# Patient Record
Sex: Female | Born: 1985 | Race: Black or African American | Hispanic: No | Marital: Single | Smoking: Never smoker
Health system: Southern US, Community
[De-identification: ages and names within clinical notes are randomized; demographics above are authoritative.]

## PROBLEM LIST (undated history)

## (undated) DIAGNOSIS — A749 Chlamydial infection, unspecified: Secondary | ICD-10-CM

## (undated) DIAGNOSIS — N390 Urinary tract infection, site not specified: Secondary | ICD-10-CM

## (undated) DIAGNOSIS — E669 Obesity, unspecified: Secondary | ICD-10-CM

## (undated) DIAGNOSIS — D649 Anemia, unspecified: Secondary | ICD-10-CM

## (undated) DIAGNOSIS — I10 Essential (primary) hypertension: Secondary | ICD-10-CM

## (undated) DIAGNOSIS — F419 Anxiety disorder, unspecified: Secondary | ICD-10-CM

## (undated) DIAGNOSIS — F32A Depression, unspecified: Secondary | ICD-10-CM

## (undated) HISTORY — PX: NO PAST SURGERIES: SHX2092

---
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
## 2007-03-17 ENCOUNTER — Emergency Department (HOSPITAL_COMMUNITY): Admission: EM | Admit: 2007-03-17 | Discharge: 2007-03-17 | Payer: Self-pay | Admitting: Emergency Medicine

## 2007-03-19 ENCOUNTER — Emergency Department (HOSPITAL_COMMUNITY): Admission: EM | Admit: 2007-03-19 | Discharge: 2007-03-19 | Payer: Self-pay | Admitting: Family Medicine

## 2008-09-05 ENCOUNTER — Emergency Department (HOSPITAL_COMMUNITY): Admission: EM | Admit: 2008-09-05 | Discharge: 2008-09-05 | Payer: Self-pay | Admitting: Emergency Medicine

## 2008-09-15 ENCOUNTER — Emergency Department (HOSPITAL_COMMUNITY): Admission: EM | Admit: 2008-09-15 | Discharge: 2008-09-15 | Payer: Self-pay | Admitting: Family Medicine

## 2009-05-17 ENCOUNTER — Emergency Department (HOSPITAL_COMMUNITY): Admission: EM | Admit: 2009-05-17 | Discharge: 2009-05-17 | Payer: Self-pay | Admitting: Emergency Medicine

## 2010-04-09 ENCOUNTER — Emergency Department (HOSPITAL_COMMUNITY): Admission: EM | Admit: 2010-04-09 | Discharge: 2010-04-09 | Payer: Self-pay | Admitting: Family Medicine

## 2010-10-24 LAB — CBC
HCT: 35.8 % — ABNORMAL LOW (ref 36.0–46.0)
Platelets: 354 10*3/uL (ref 150–400)
RDW: 14.8 % (ref 11.5–15.5)
WBC: 10.8 10*3/uL — ABNORMAL HIGH (ref 4.0–10.5)

## 2010-10-24 LAB — DIFFERENTIAL
Basophils Absolute: 0 10*3/uL (ref 0.0–0.1)
Lymphocytes Relative: 12 % (ref 12–46)
Monocytes Absolute: 0.7 10*3/uL (ref 0.1–1.0)
Neutro Abs: 8.5 10*3/uL — ABNORMAL HIGH (ref 1.7–7.7)

## 2010-10-24 LAB — POCT PREGNANCY, URINE: Preg Test, Ur: NEGATIVE

## 2010-10-24 LAB — POCT URINALYSIS DIPSTICK
Ketones, ur: NEGATIVE mg/dL
Nitrite: NEGATIVE
Specific Gravity, Urine: 1.02 (ref 1.005–1.030)
Urobilinogen, UA: 1 mg/dL (ref 0.0–1.0)

## 2010-11-13 LAB — CULTURE, ROUTINE-ABSCESS

## 2010-11-25 LAB — CULTURE, ROUTINE-ABSCESS

## 2011-05-25 LAB — CULTURE, ROUTINE-ABSCESS

## 2012-01-05 ENCOUNTER — Encounter (HOSPITAL_COMMUNITY): Payer: Self-pay | Admitting: Emergency Medicine

## 2012-01-05 ENCOUNTER — Emergency Department (INDEPENDENT_AMBULATORY_CARE_PROVIDER_SITE_OTHER)
Admission: EM | Admit: 2012-01-05 | Discharge: 2012-01-05 | Disposition: A | Discharge status: Home / Self Care | Payer: Self-pay | Source: Home / Self Care | Attending: Emergency Medicine | Admitting: Emergency Medicine

## 2012-01-05 DIAGNOSIS — L304 Erythema intertrigo: Secondary | ICD-10-CM

## 2012-01-05 DIAGNOSIS — L0390 Cellulitis, unspecified: Secondary | ICD-10-CM

## 2012-01-05 DIAGNOSIS — L538 Other specified erythematous conditions: Secondary | ICD-10-CM

## 2012-01-05 DIAGNOSIS — N760 Acute vaginitis: Secondary | ICD-10-CM

## 2012-01-05 DIAGNOSIS — L0291 Cutaneous abscess, unspecified: Secondary | ICD-10-CM

## 2012-01-05 HISTORY — DX: Urinary tract infection, site not specified: N39.0

## 2012-01-05 HISTORY — DX: Obesity, unspecified: E66.9

## 2012-01-05 HISTORY — DX: Chlamydial infection, unspecified: A74.9

## 2012-01-05 LAB — WET PREP, GENITAL

## 2012-01-05 LAB — POCT URINALYSIS DIP (DEVICE)
Protein, ur: 30 mg/dL — AB
Specific Gravity, Urine: 1.015 (ref 1.005–1.030)
Urobilinogen, UA: 2 mg/dL — ABNORMAL HIGH (ref 0.0–1.0)

## 2012-01-05 MED ORDER — CEPHALEXIN 500 MG PO CAPS: 500.0000 mg | ORAL_CAPSULE | Freq: Four times a day (QID) | ORAL | Status: AC

## 2012-01-05 MED ORDER — METRONIDAZOLE 500 MG PO TABS: 500.0000 mg | ORAL_TABLET | Freq: Two times a day (BID) | ORAL | Status: AC

## 2012-01-05 MED ORDER — FLUCONAZOLE 150 MG PO TABS: ORAL_TABLET | ORAL | Status: AC

## 2012-01-05 MED ORDER — MICONAZOLE NITRATE 2 % EX CREA: TOPICAL_CREAM | Freq: Two times a day (BID) | CUTANEOUS | Status: AC

## 2012-01-05 NOTE — Discharge Instructions (Signed)
Take the medication as written. Give us a working phone number so that we can contact you if needed. Refrain from sexual contact until you know your results and your partner(s) are treated. Return if you get worse, have a fever >100.4, or for any concerns.   Go to www.goodrx.com to look up your medications. This will give you a list of where you can find your prescriptions at the most affordable prices.    Go to www.goodrx.com to look up your medications. This will give you a list of where you can find your prescriptions at the most affordable prices.   Call [facility]  [phone]  If you have no primary doctor, here are some resources that may be helpful:  Medicaid-accepting [place] Providers:   - [facility]- [address]      [phone]      Mon-Fri 9am-7pm, Sat 9am-1pm   - [facility]- [address], [address]      [phone]   - [facility]- [address]      [phone]   - [facility]- [address] [place] [address]      [phone]   - [facility]- [address]      [phone]      Only accepts [place] Access Medicaid patients       after they have her name applied to their card   Self Pay (no insurance) in [place]:   - Sickle Cell Patients: Dr Eric Dean, [facility]      [address]      [phone]   - [facility]- [phone]   - Physician Referral Service- [phone]   - [facility]- [address]      [phone]   - [facility]- [address]   - [facility]- see information above      (Speak to Pam H if you do not have insurance)   - [facility]- [address]      [phone]   - [facility] [place]- [address]      [phone]   - [facility]- [address] [place] [address]      [phone]   - Dr Osei-Bonsu-  [address],  [place]      [phone]   - [facility]- [address]      [phone]   - [facility]- [address] [place] [address]      [phone]      Also [address]      [phone]   - [facility]- [address]      [phone]      1st and 3rd Saturday every month, 10am-1pm    Other agencies that provide inexpensive medical care:     [facility]  [phone]    [facility]  [phone]    [facility]  [phone]    [facility]  [phone] [place] [address]    [facility]  [phone]    [facility]  [phone] [facility] [phone]   [address] [place], [place] [address]  Chronic Pain Problems Contact [facility]    [phone] Patients need to be referred by their primary care doctor.  [place] Resources  [facility] [place]     [employer]                          [place] [facility] [address] [place]                       [address]      [address]   [phone] (After Hours)  General Information: Finding a doctor when you do not have health insurance can be tricky. Although you are not limited by an insurance plan, you are of course limited by her finances and how much but he can pay out of pocket.  What are your options if you don't have health insurance?   1) Find a Doctor and Pay Out of Pocket Although you won't have to find out who is covered by your insurance plan, it is a good idea to ask around and get recommendations. You will then need to call the office and see if the doctor you have chosen will accept you as a new patient and what types of options they offer for patients who are self-pay. Some doctors offer discounts or will set up payment plans for their patients who do not have insurance, but you will need to ask so you aren't surprised when  you get to your appointment.  2) Contact Your Local Health Department Not all health departments have doctors that can see patients for sick visits, but many do, so it is worth a call to see if yours does. If you don't know where your local health department is, you can check in your phone book. The CDC also has a tool to help you locate your state's health department, and many state websites also have listings of all of their local health departments.  3) Find a Walk-in Clinic If your illness is not likely to be very severe or complicated, you may want to try a walk in clinic. These are popping up all over the country in pharmacies, drugstores, and shopping centers. They're usually staffed by nurse practitioners or physician assistants that have been trained to treat common illnesses and complaints. They're usually fairly quick and inexpensive. However, if you have serious medical issues or chronic medical problems, these are probably not your best option   

## 2012-01-05 NOTE — ED Notes (Signed)
Pt having a boil under her panis that started off as a pressure and has since gotten bigger and is spreading after a warm bath. Pt now describes it as large and flat with 4 boils underneath. Pt states the pain is so bad that she feels nauseous and has vomited with standing. Pt also states she has had recent itching, malodorous discharge, and bumps in her vagina.

## 2012-01-06 LAB — GC/CHLAMYDIA PROBE AMP, GENITAL: GC Probe Amp, Genital: NEGATIVE

## 2012-01-06 NOTE — ED Provider Notes (Signed)
History     CSN: [id number]  Arrival date & time 01/05/12  0841   First MD Initiated Contact with Patient 01/05/12 [phone]      Chief Complaint  Patient presents with  . Recurrent Skin Infections    (Consider location/radiation/quality/duration/timing/severity/associated sxs/prior treatment) HPI Comments: Patient presents with several issues: First, patient reports a tender, erythematous area underneath her pannus and upper groin. States it started off as a sore, itchy area with an odor, and is now getting worse. She tried taking a warm bath, which made her symptoms worse. No fevers at home. States she vomited once due to the pain. Is otherwise tolerating by mouth. Patient is morbidly obese, and has difficulty with personal hygiene. She has no history of diabetes. Second, She also reports painful "bumps" on her mons pubis starting around this time. Unsure if they are blisters. She has no history of herpes. Pain is worse with palpation. No alleviating factors. She has not tried anything for this. Third, she reports an odorous vaginal discharge and vaginal itching. No aggravating or alleviating factors. No urinary urgency, frequency, dysuria, cloudy or oderous urine. No known vaginal blisters. No recent antibiotics. She states she is currently not sexually active. Last intercourse with a female partner on 4/17. They do not use condoms. She does not know if he is having any symptoms. She has a remote history of Chlamydia. No history of gonorrhea, BV, yeast vaginitis, trichomoniasis, herpes, HIV, syphilis.    ROS as noted in HPI. All other ROS negative.   Patient is a 26 y.o. female presenting with vaginal discharge and rash. The history is provided by the patient. No language interpreter was used.  Vaginal Discharge This is a new problem. The current episode started 2 days ago. The problem occurs constantly. The problem has been gradually worsening. Pertinent negatives include no abdominal pain. The  symptoms are aggravated by nothing. The symptoms are relieved by nothing. She has tried nothing for the symptoms. The treatment provided no relief.  Rash  This is a new problem. The current episode started more than 2 days ago. The problem is associated with an unknown factor. There has been no fever. The rash is present on the groin and abdomen. Associated symptoms include blisters and pain. Pertinent negatives include no itching and no weeping. She has tried nothing for the symptoms. The treatment provided no relief.    Past Medical History  Diagnosis Date  . Chlamydia   . Obesity   . UTI (lower urinary tract infection)     proteus UTI    History reviewed. No pertinent past surgical history.  History reviewed. No pertinent family history.  History  Substance Use Topics  . Smoking status: Never Smoker   . Smokeless tobacco: Not on file  . Alcohol Use: No    OB History    Grav Para Term Preterm Abortions TAB SAB Ect Mult Living                  Review of Systems  Gastrointestinal: Negative for abdominal pain.  Genitourinary: Positive for vaginal discharge.  Skin: Positive for rash. Negative for itching.    Allergies  Review of patient's allergies indicates no known allergies.  Home Medications   Current Outpatient Rx  Name Route Sig Dispense Refill  . CEPHALEXIN 500 MG PO CAPS Oral Take 1 capsule (500 mg total) by mouth 4 (four) times daily. X 10 days 40 capsule 0  . FLUCONAZOLE 150 MG PO TABS  1  tab po x 1. May repeat in 72 hours if no improvement 2 tablet 0  . METRONIDAZOLE 500 MG PO TABS Oral Take 1 tablet (500 mg total) by mouth 2 (two) times daily. X 7 days 14 tablet 0  . MICONAZOLE NITRATE 2 % EX CREA Topical Apply topically 2 (two) times daily. 28.35 g 0    BP 155/98  Pulse 115  Temp(Src) 100 F (37.8 C) (Oral)  Resp 18  SpO2 100%  LMP 12/21/2011  Physical Exam  Nursing note and vitals reviewed. Constitutional: She is oriented to person, place, and  time. She appears well-developed and well-nourished. No distress.       Morbidly obese  HENT:  Head: Normocephalic and atraumatic.  Eyes: Conjunctivae and EOM are normal.  Neck: Normal range of motion.  Cardiovascular: Normal rate and regular rhythm.   Pulmonary/Chest: Effort normal.  Abdominal: Soft. Bowel sounds are normal. She exhibits no distension. There is no tenderness. There is no CVA tenderness.         Symmetric, tender, macerated area with induration underneath pannus. No central fluctuance.  Genitourinary: There is no rash on the right labia. There is no rash on the left labia. Uterus is not tender. Cervix exhibits discharge. Cervix exhibits no motion tenderness and no friability. Right adnexum displays no mass, no tenderness and no fullness. Left adnexum displays no mass, no tenderness and no fullness.       Thin yellowish oderous vaginal discharge. No ulcers or vaginal walls or on os Several tender raised papules on mons pubis. No evidence of pubic lice.  Musculoskeletal: Normal range of motion.  Neurological: She is alert and oriented to person, place, and time.  Skin: Skin is warm and dry. Rash noted.       See abdominal and GU exam  Psychiatric: She has a normal mood and affect. Her behavior is normal. Judgment and thought content normal.    ED Course  Procedures (including critical care time)  Labs Reviewed  WET PREP, GENITAL - Abnormal; Notable for the following:    Trich, Wet Prep MODERATE (*)    Clue Cells Wet Prep HPF POC FEW (*)    WBC, Wet Prep HPF POC TOO NUMEROUS TO COUNT (*)    All other components within normal limits  POCT URINALYSIS DIP (DEVICE) - Abnormal; Notable for the following:    Bilirubin Urine SMALL (*)    Ketones, ur TRACE (*)    Hgb urine dipstick TRACE (*)    Protein, ur 30 (*)    Urobilinogen, UA 2.0 (*)    Leukocytes, UA SMALL (*) Biochemical Testing Only. Please order routine urinalysis from main lab if confirmatory testing is needed.     All other components within normal limits  POCT PREGNANCY, URINE  GC/CHLAMYDIA PROBE AMP, GENITAL  HERPES SIMPLEX VIRUS CULTURE   No results found.   1. Intertrigo   2. Vaginitis   3. Cellulitis     MDM  H&P most c/w candidal intertrigo with secondary cellulitis underneath pannus. Patient has tender papules on her mons pubis, could be foliculitis which will be treated with Keflex. unroofed a papule and sent this off for HSV.Marland Kitchen Deferring acyclovir treatment until results obtained. Pelvic exam is consistent BV. Sent off GC/chlamydia, wet prep.  Will not treat empirically now for gonorrhea and Chlamydia. Will send home with flagyl for BV, topical miconazole and diflucan for yeast infection. Keflex for the secondary cellulitis. Advised pt to refrain from sexual contact until she knows  lab results, symptoms resolve, and partner(s) are treated. Pt provided working phone number. Pt agrees.     Luiz Blare, MD 01/06/12 1626

## 2012-01-07 ENCOUNTER — Telehealth (HOSPITAL_COMMUNITY): Payer: Self-pay | Admitting: *Deleted

## 2012-01-07 LAB — HERPES SIMPLEX VIRUS CULTURE: Special Requests: NORMAL

## 2012-01-07 NOTE — ED Notes (Signed)
GC/Chlamydia neg., Wet prep: Mod. Trich., few clue cells, WBC's TNTC, Herpes culture: No Herpes Simplex detected.  Pt. adequately treated with Flagyl.  I called and left message for pt. to call. Vassie Moselle 01/07/2012

## 2012-01-08 ENCOUNTER — Telehealth (HOSPITAL_COMMUNITY): Payer: Self-pay | Admitting: *Deleted

## 2012-01-13 ENCOUNTER — Telehealth (HOSPITAL_COMMUNITY): Payer: Self-pay | Admitting: *Deleted

## 2012-01-13 NOTE — ED Notes (Signed)
Unable to reach pt. by phone. Letter sent. Colleen Garrett 01/13/2012

## 2012-02-01 ENCOUNTER — Telehealth (HOSPITAL_COMMUNITY): Payer: Self-pay | Admitting: *Deleted

## 2012-02-01 NOTE — ED Notes (Signed)
Letter returned unopened.  Unable to contact pt. Colleen Garrett 02/01/2012

## 2013-09-14 ENCOUNTER — Emergency Department (INDEPENDENT_AMBULATORY_CARE_PROVIDER_SITE_OTHER)
Admission: EM | Admit: 2013-09-14 | Discharge: 2013-09-14 | Disposition: A | Discharge status: Home / Self Care | Payer: Self-pay | Source: Home / Self Care | Attending: Family Medicine | Admitting: Family Medicine

## 2013-09-14 ENCOUNTER — Encounter (HOSPITAL_COMMUNITY): Payer: Self-pay | Admitting: Emergency Medicine

## 2013-09-14 DIAGNOSIS — I10 Essential (primary) hypertension: Secondary | ICD-10-CM

## 2013-09-14 DIAGNOSIS — J020 Streptococcal pharyngitis: Secondary | ICD-10-CM

## 2013-09-14 LAB — POCT I-STAT, CHEM 8
BUN: 16 mg/dL (ref 6–23)
CALCIUM ION: 1.2 mmol/L (ref 1.12–1.23)
Chloride: 102 mEq/L (ref 96–112)
Creatinine, Ser: 1 mg/dL (ref 0.50–1.10)
Glucose, Bld: 102 mg/dL — ABNORMAL HIGH (ref 70–99)
HCT: 33 % — ABNORMAL LOW (ref 36.0–46.0)
HEMOGLOBIN: 11.2 g/dL — AB (ref 12.0–15.0)
Potassium: 3.6 mEq/L — ABNORMAL LOW (ref 3.7–5.3)
SODIUM: 142 meq/L (ref 137–147)
TCO2: 29 mmol/L (ref 0–100)

## 2013-09-14 LAB — POCT RAPID STREP A: Streptococcus, Group A Screen (Direct): POSITIVE — AB

## 2013-09-14 MED ORDER — HYDROCHLOROTHIAZIDE 25 MG PO TABS: 25.0000 mg | ORAL_TABLET | Freq: Every day | ORAL | Status: DC

## 2013-09-14 MED ORDER — AMOXICILLIN 500 MG PO CAPS: 500.0000 mg | ORAL_CAPSULE | Freq: Two times a day (BID) | ORAL | Status: DC

## 2013-09-14 NOTE — ED Provider Notes (Signed)
Colleen Garrett is a 28 y.o. female who presents to Urgent Care today for 8-9 days of sore throat nausea and pain with swallowing. Patient has moderate to severe throat pain worse with swallowing. She denies any significant cough or shortness of breath. She had some vomiting last week but currently has no vomiting. She's tried some Tylenol which has not helped very much.  She has had elevated blood pressure in the past but has never been diagnosed with hypertension. She currently denies any syncope lightheadedness or weakness chest pains or palpitations.   Past Medical History  Diagnosis Date  . Chlamydia   . Obesity   . UTI (lower urinary tract infection)     proteus UTI   History  Substance Use Topics  . Smoking status: Never Smoker   . Smokeless tobacco: Not on file  . Alcohol Use: No   ROS as above Medications: No current facility-administered medications for this encounter.   Current Outpatient Prescriptions  Medication Sig Dispense Refill  . amoxicillin (AMOXIL) 500 MG capsule Take 1 capsule (500 mg total) by mouth 2 (two) times daily.  20 capsule  0  . hydrochlorothiazide (HYDRODIURIL) 25 MG tablet Take 1 tablet (25 mg total) by mouth daily.  30 tablet  2    Exam:  BP 161/112  Pulse 108  Temp(Src) 98.5 F (36.9 C) (Oral)  Resp 18  SpO2 98%  LMP 09/04/2013 Gen: Well NAD morbidly obese HEENT: EOMI,  MMM posterior pharynx is very erythematous. Tympanic membranes are normal appearing bilaterally. Patient has tender left-sided lymphadenopathy in the anterior cervical chain. Lungs: Normal work of breathing. CTABL Heart: RRR no MRG Abd: NABS, Soft. NT, ND Exts: Brisk capillary refill, warm and well perfused.   Results for orders placed during the hospital encounter of 09/14/13 (from the past 24 hour(s))  POCT RAPID STREP A ([facility] ONLY)     Status: Abnormal   Collection Time    09/14/13  8:24 AM      Result Value Range   Streptococcus, Group A Screen (Direct)  POSITIVE (*) NEGATIVE  POCT I-STAT, CHEM 8     Status: Abnormal   Collection Time    09/14/13  8:38 AM      Result Value Range   Sodium 142  137 - 147 mEq/L   Potassium 3.6 (*) 3.7 - 5.3 mEq/L   Chloride 102  96 - 112 mEq/L   BUN 16  6 - 23 mg/dL   Creatinine, Ser 1.611.00  0.50 - 1.10 mg/dL   Glucose, Bld 096102 (*) 70 - 99 mg/dL   Calcium, Ion 0.451.20  1.12 - 1.23 mmol/L   TCO2 29  0 - 100 mmol/L   Hemoglobin 11.2 (*) 12.0 - 15.0 g/dL   HCT 40.933.0 (*) 81.136.0 - 91.446.0 %   No results found.  Assessment and Plan: 28 y.o. female with  1) strep pharyngitis. Plan to treat with amoxicillin. Additionally we'll use Tylenol for pain control. Work note provided as well.  2) hypertension: Patient has had several episodes of elevated blood pressure. Plan to start patient on hydrochlorothiazide. Recommend followup at the [facility] in a few weeks.  Discussed warning signs or symptoms. Please see discharge instructions. Patient expresses understanding.    Rodolph BongEvan S Shamir Sedlar, MD 09/14/13 [phone]

## 2013-09-14 NOTE — ED Notes (Signed)
C/o sore throat for a week and half now  States she has had cold sx but now she only has a sore throat OTC medication used but no relief.

## 2013-09-14 NOTE — Discharge Instructions (Signed)
Thank you for coming in today. Strep Throat:  Take amoxicillin twice daily for 10 days.  Use 2 extra strength tylenol every 6 hours for pain as needed for pain.   High Blood Pressure: Please start taking hydrochlorothiazide (HCTZ) daily.  Get your blood pressure rechecked in about one month. Call or go to the emergency room if you get worse, have trouble breathing, have chest pains, or palpitations.   Please see Rudell Cobb to qualify for reduced or free medical services within the [facility].  Call her at [phone] today. Follow up at the [facility] [address] [place], [place] [address] [phone]   Arterial Hypertension Arterial hypertension (high blood pressure) is a condition of elevated pressure in your blood vessels. Hypertension over a long period of time is a risk factor for strokes, heart attacks, and heart failure. It is also the leading cause of kidney (renal) failure.  CAUSES   In Adults -- Over 90% of all hypertension has no known cause. This is called essential or primary hypertension. In the other 10% of people with hypertension, the increase in blood pressure is caused by another disorder. This is called secondary hypertension. Important causes of secondary hypertension are:  Heavy alcohol use.  Obstructive sleep apnea.  Hyperaldosterosim (Conn's syndrome).  Steroid use.  Chronic kidney failure.  Hyperparathyroidism.  Medications.  Renal artery stenosis.  Pheochromocytoma.  Cushing's disease.  Coarctation of the aorta.  Scleroderma renal crisis.  Licorice (in excessive amounts).  Drugs (cocaine, methamphetamine). Your caregiver can explain any items above that apply to you.  In Children -- Secondary hypertension is more common and should always be considered.  Pregnancy -- Few women of childbearing age have high blood pressure. However, up to 10% of them develop hypertension of pregnancy.  Generally, this will not harm the woman. It may be a sign of 3 complications of pregnancy: preeclampsia, HELLP syndrome, and eclampsia. Follow up and control with medication is necessary. SYMPTOMS   This condition normally does not produce any noticeable symptoms. It is usually found during a routine exam.  Malignant hypertension is a late problem of high blood pressure. It may have the following symptoms:  Headaches.  Blurred vision.  End-organ damage (this means your kidneys, heart, lungs, and other organs are being damaged).  Stressful situations can increase the blood pressure. If a person with normal blood pressure has their blood pressure go up while being seen by their caregiver, this is often termed "white coat hypertension." Its importance is not known. It may be related with eventually developing hypertension or complications of hypertension.  Hypertension is often confused with mental tension, stress, and anxiety. DIAGNOSIS  The diagnosis is made by 3 separate blood pressure measurements. They are taken at least 1 week apart from each other. If there is organ damage from hypertension, the diagnosis may be made without repeat measurements. Hypertension is usually identified by having blood pressure readings:  Above 140/90 mmHg measured in both arms, at 3 separate times, over a couple weeks.  Over 130/80 mmHg should be considered a risk factor and may require treatment in patients with diabetes. Blood pressure readings over 120/80 mmHg are called "pre-hypertension" even in non-diabetic patients. To get a true blood pressure measurement, use the following guidelines. Be aware of the factors that can alter blood pressure readings.  Take measurements at least 1 hour after caffeine.  Take measurements 30 minutes after smoking and without any stress. This is another  reason to quit smoking  it raises your blood pressure.  Use a proper cuff size. Ask your caregiver if you are not sure  about your cuff size.  Most home blood pressure cuffs are automatic. They will measure systolic and diastolic pressures. The systolic pressure is the pressure reading at the start of sounds. Diastolic pressure is the pressure at which the sounds disappear. If you are elderly, measure pressures in multiple postures. Try sitting, lying or standing.  Sit at rest for a minimum of 5 minutes before taking measurements.  You should not be on any medications like decongestants. These are found in many cold medications.  Record your blood pressure readings and review them with your caregiver. If you have hypertension:  Your caregiver may do tests to be sure you do not have secondary hypertension (see "causes" above).  Your caregiver may also look for signs of metabolic syndrome. This is also called Syndrome X or Insulin Resistance Syndrome. You may have this syndrome if you have type 2 diabetes, abdominal obesity, and abnormal blood lipids in addition to hypertension.  Your caregiver will take your medical and family history and perform a physical exam.  Diagnostic tests may include blood tests (for glucose, cholesterol, potassium, and kidney function), a urinalysis, or an EKG. Other tests may also be necessary depending on your condition. PREVENTION  There are important lifestyle issues that you can adopt to reduce your chance of developing hypertension:  Maintain a normal weight.  Limit the amount of salt (sodium) in your diet.  Exercise often.  Limit alcohol intake.  Get enough potassium in your diet. Discuss specific advice with your caregiver.  Follow a DASH diet (dietary approaches to stop hypertension). This diet is rich in fruits, vegetables, and low-fat dairy products, and avoids certain fats. PROGNOSIS  Essential hypertension cannot be cured. Lifestyle changes and medical treatment can lower blood pressure and reduce complications. The prognosis of secondary hypertension depends on  the underlying cause. Many people whose hypertension is controlled with medicine or lifestyle changes can live a normal, healthy life.  RISKS AND COMPLICATIONS  While high blood pressure alone is not an illness, it often requires treatment due to its short- and long-term effects on many organs. Hypertension increases your risk for:  CVAs or strokes (cerebrovascular accident).  Heart failure due to chronically high blood pressure (hypertensive cardiomyopathy).  Heart attack (myocardial infarction).  Damage to the retina (hypertensive retinopathy).  Kidney failure (hypertensive nephropathy). Your caregiver can explain list items above that apply to you. Treatment of hypertension can significantly reduce the risk of complications. TREATMENT   For overweight patients, weight loss and regular exercise are recommended. Physical fitness lowers blood pressure.  Mild hypertension is usually treated with diet and exercise. A diet rich in fruits and vegetables, fat-free dairy products, and foods low in fat and salt (sodium) can help lower blood pressure. Decreasing salt intake decreases blood pressure in a 1/3 of people.  Stop smoking if you are a smoker. The steps above are highly effective in reducing blood pressure. While these actions are easy to suggest, they are difficult to achieve. Most patients with moderate or severe hypertension end up requiring medications to bring their blood pressure down to a normal level. There are several classes of medications for treatment. Blood pressure pills (antihypertensives) will lower blood pressure by their different actions. Lowering the blood pressure by 10 mmHg may decrease the risk of complications by as much as 25%. The goal of treatment is effective  blood pressure control. This will reduce your risk for complications. Your caregiver will help you determine the best treatment for you according to your lifestyle. What is excellent treatment for one person,  may not be for you. HOME CARE INSTRUCTIONS   Do not smoke.  Follow the lifestyle changes outlined in the "Prevention" section.  If you are on medications, follow the directions carefully. Blood pressure medications must be taken as prescribed. Skipping doses reduces their benefit. It also puts you at risk for problems.  Follow up with your caregiver, as directed.  If you are asked to monitor your blood pressure at home, follow the guidelines in the "Diagnosis" section above. SEEK MEDICAL CARE IF:   You think you are having medication side effects.  You have recurrent headaches or lightheadedness.  You have swelling in your ankles.  You have trouble with your vision. SEEK IMMEDIATE MEDICAL CARE IF:   You have sudden onset of chest pain or pressure, difficulty breathing, or other symptoms of a heart attack.  You have a severe headache.  You have symptoms of a stroke (such as sudden weakness, difficulty speaking, difficulty walking). MAKE SURE YOU:   Understand these instructions.  Will watch your condition.  Will get help right away if you are not doing well or get worse. Document Released: 07/27/2005 Document Revised: 10/19/2011 Document Reviewed: 02/24/2007 [facility] Patient Information 2014 [place], [place].

## 2013-10-31 ENCOUNTER — Inpatient Hospital Stay (HOSPITAL_COMMUNITY)
Admission: AD | Admit: 2013-10-31 | Discharge: 2013-10-31 | Disposition: A | Discharge status: Home / Self Care | Payer: Self-pay | Source: Ambulatory Visit | Attending: Family Medicine | Admitting: Family Medicine

## 2013-10-31 DIAGNOSIS — I10 Essential (primary) hypertension: Secondary | ICD-10-CM | POA: Insufficient documentation

## 2013-10-31 DIAGNOSIS — Z3202 Encounter for pregnancy test, result negative: Secondary | ICD-10-CM

## 2013-10-31 LAB — URINALYSIS, ROUTINE W REFLEX MICROSCOPIC
Bilirubin Urine: NEGATIVE
Glucose, UA: NEGATIVE mg/dL
Hgb urine dipstick: NEGATIVE
Ketones, ur: NEGATIVE mg/dL
LEUKOCYTES UA: NEGATIVE
Nitrite: NEGATIVE
PROTEIN: NEGATIVE mg/dL
Specific Gravity, Urine: >1.03 — ABNORMAL HIGH (ref 1.005–1.030)
UROBILINOGEN UA: 0.2 mg/dL (ref 0.0–1.0)
pH: 6 (ref 5.0–8.0)

## 2013-10-31 LAB — HCG, QUANTITATIVE, PREGNANCY: hCG, Beta Chain, Quant, S: 1 m[IU]/mL (ref <5)

## 2013-10-31 NOTE — MAU Note (Signed)
Patient states she has had a positive home pregnancy test. States she has a little nausea off and on. Denies any other problems, no pain, bleeding or discharge.

## 2013-10-31 NOTE — MAU Provider Note (Signed)
HPI:  Ms. Colleen Garrett is a [age] y.o. female G0 who presents to MAU for pregnancy confirmation. She had a positive pregnancy test at home and came here for confirmation. She and her significant other have been trying to conceive for 3 months. Urine pregnancy test in MAU was negative; beta hcg level ordered. Pt denies pain or bleeding at this time.    Objective:  GENERAL: Well-developed, well-nourished, obese female in no acute distress, pt is tearful  HEENT: Normocephalic, atraumatic.   LUNGS: Effort normal HEART: Regular rate  SKIN: Warm, dry and without erythema PSYCH: Normal mood and affect  Filed Vitals:   10/31/13 1301  BP: 165/107  Pulse: 96  Temp: 98.5 F (36.9 C)  Resp: 20    Results for orders placed during the hospital encounter of 10/31/13 (from the past 48 hour(s))  URINALYSIS, ROUTINE W REFLEX MICROSCOPIC     Status: Abnormal   Collection Time    10/31/13  1:05 PM      Result Value Ref Range   Color, Urine YELLOW  YELLOW   APPearance CLEAR  CLEAR   Specific Gravity, Urine >1.030 (*) 1.005 - 1.030   pH 6.0  5.0 - 8.0   Glucose, UA NEGATIVE  NEGATIVE mg/dL   Hgb urine dipstick NEGATIVE  NEGATIVE   Bilirubin Urine NEGATIVE  NEGATIVE   Ketones, ur NEGATIVE  NEGATIVE mg/dL   Protein, ur NEGATIVE  NEGATIVE mg/dL   Urobilinogen, UA 0.2  0.0 - 1.0 mg/dL   Nitrite NEGATIVE  NEGATIVE   Leukocytes, UA NEGATIVE  NEGATIVE   Comment: MICROSCOPIC NOT DONE ON URINES WITH NEGATIVE PROTEIN, BLOOD, LEUKOCYTES, NITRITE, OR GLUCOSE <1000 mg/dL.  HCG, QUANTITATIVE, PREGNANCY     Status: None   Collection Time    10/31/13  1:36 PM      Result Value Ref Range   hCG, Beta Chain, Quant, S 1  <5 mIU/mL   Comment:              GEST. AGE      CONC.  (mIU/mL)       <=1 WEEK        5 - 50         2 WEEKS       50 - 500         3 WEEKS       100 - 10,000         4 WEEKS     1,000 - 30,000         5 WEEKS     3,500 - 115,000       6-8 WEEKS     12,000 - 270,000        12 WEEKS      15,000 - 220,000                FEMALE AND NON-PREGNANT FEMALE:         LESS THAN 5 mIU/mL    MDM Urine pregnancy test negative Beta hcg level ordered UA  A: Encounter for pregnancy test with beta quant negative Chronic hypertension   P: Discharge home in stable condition Discussed the importance of taking BP medication as [facility] provided to [facility] patient  Stroke signs and symptoms reviewed; pt to go to the nearest ED with any chest pain/ HA .    Iona HansenJennifer Irene Rasch, NP 10/31/2013 3:10 PM

## 2013-10-31 NOTE — MAU Provider Note (Signed)
Attestation of Attending Supervision of Advanced Practitioner (PA/CNM/NP): Evaluation and management procedures were performed by the Advanced Practitioner under my supervision and collaboration.  I have reviewed the Advanced Practitioner's note and chart, and I agree with the management and plan.  Madie Cahn S, MD [facility] Attending 10/31/2013 3:45 PM   

## 2014-04-24 ENCOUNTER — Emergency Department (HOSPITAL_COMMUNITY)
Admission: EM | Admit: 2014-04-24 | Discharge: 2014-04-24 | Disposition: A | Discharge status: Home / Self Care | Payer: Self-pay | Attending: Emergency Medicine | Admitting: Emergency Medicine

## 2014-04-24 ENCOUNTER — Encounter (HOSPITAL_COMMUNITY): Payer: Self-pay | Admitting: Emergency Medicine

## 2014-04-24 DIAGNOSIS — IMO0002 Reserved for concepts with insufficient information to code with codable children: Secondary | ICD-10-CM | POA: Insufficient documentation

## 2014-04-24 DIAGNOSIS — E669 Obesity, unspecified: Secondary | ICD-10-CM | POA: Insufficient documentation

## 2014-04-24 DIAGNOSIS — L089 Local infection of the skin and subcutaneous tissue, unspecified: Secondary | ICD-10-CM | POA: Insufficient documentation

## 2014-04-24 DIAGNOSIS — Z79899 Other long term (current) drug therapy: Secondary | ICD-10-CM | POA: Insufficient documentation

## 2014-04-24 DIAGNOSIS — L02412 Cutaneous abscess of left axilla: Secondary | ICD-10-CM

## 2014-04-24 DIAGNOSIS — Z8619 Personal history of other infectious and parasitic diseases: Secondary | ICD-10-CM | POA: Insufficient documentation

## 2014-04-24 DIAGNOSIS — Z87440 Personal history of urinary (tract) infections: Secondary | ICD-10-CM | POA: Insufficient documentation

## 2014-04-24 MED ORDER — LIDOCAINE-EPINEPHRINE 2 %-1:100000 IJ SOLN
20.0000 mL | Freq: Once | INTRAMUSCULAR | Status: DC
  Filled 2014-04-24: qty 20

## 2014-04-24 MED ORDER — LIDOCAINE-EPINEPHRINE (PF) 2 %-1:200000 IJ SOLN
20.0000 mL | Freq: Once | INTRAMUSCULAR | Status: AC
  Administered 2014-04-24: 20 mL via INTRADERMAL
  Filled 2014-04-24: qty 20

## 2014-04-24 MED ORDER — HYDROCHLOROTHIAZIDE 25 MG PO TABS: 25.0000 mg | ORAL_TABLET | Freq: Every day | ORAL | Status: DC

## 2014-04-24 MED ORDER — SULFAMETHOXAZOLE-TRIMETHOPRIM 800-160 MG PO TABS: 2.0000 | ORAL_TABLET | Freq: Two times a day (BID) | ORAL | Status: DC

## 2014-04-24 MED ORDER — LIDOCAINE-EPINEPHRINE 2 %-1:100000 IJ SOLN
10.0000 mL | Freq: Once | INTRAMUSCULAR | Status: DC
  Filled 2014-04-24: qty 10

## 2014-04-24 MED ORDER — HYDROCODONE-ACETAMINOPHEN 5-325 MG PO TABS: 1.0000 | ORAL_TABLET | Freq: Four times a day (QID) | ORAL | Status: DC | PRN

## 2014-04-24 NOTE — ED Notes (Signed)
Pt reports she is supposed to be taking antihypertive medication, but has not taken any "in a while." Sts "I leave the hospital and I feel great, then I start forgetting to take them."

## 2014-04-24 NOTE — ED Provider Notes (Signed)
CSN: [id number]     Arrival date & time 04/24/14  8295 History  This chart was scribed for non-physician practitioner Santiago Glad working with Audree Camel, MD by Carl Best, ED Scribe. This patient was seen in room TR11C/TR11C and the patient's care was started at 9:50 AM.      Chief Complaint  Patient presents with  . Recurrent Skin Infections    The history is provided by the patient. No language interpreter was used.   HPI Comments: Colleen Garrett is a 28 y.o. female who presents to the Emergency Department complaining of a painful abscess of her left axilla that she noticed 2 weeks ago.  Area gradually worsening.  She has not noticed any drainage. She denies nausea, vomiting, numbness or tingling in her left arm, fever, and chills as associated symptoms.   She applied Triamcinolone to the area with no relief to her symptoms.  She has not taken any medication for the pain.  She has a history of abscesses under her right arm and chest.  She does not have a history of HIV or DM.     Past Medical History  Diagnosis Date  . Chlamydia   . Obesity   . UTI (lower urinary tract infection)     proteus UTI   History reviewed. No pertinent past surgical history. No family history on file. History  Substance Use Topics  . Smoking status: Never Smoker   . Smokeless tobacco: Not on file  . Alcohol Use: No   OB History   Grav Para Term Preterm Abortions TAB SAB Ect Mult Living                 Review of Systems  Constitutional: Negative for fever and chills.  Gastrointestinal: Negative for nausea and vomiting.  Musculoskeletal: Positive for arthralgias.  Neurological: Negative for numbness.  All other systems reviewed and are negative.     Allergies  Review of patient's allergies indicates no known allergies.  Home Medications   Prior to Admission medications   Medication Sig Start Date End Date Taking? Authorizing Provider  hydrochlorothiazide (HYDRODIURIL) 25 MG  tablet Take 1 tablet (25 mg total) by mouth daily. 09/14/13  Yes Rodolph Bong, MD   BP 149/103  Pulse 106  Temp(Src) 98.5 F (36.9 C) (Oral)  Resp 18  SpO2 100%  LMP 04/01/2014  Physical Exam  Nursing note and vitals reviewed. Constitutional: She is oriented to person, place, and time. She appears well-developed and well-nourished.  HENT:  Head: Normocephalic and atraumatic.  Eyes: EOM are normal.  Neck: Normal range of motion.  Cardiovascular: Normal rate, regular rhythm and normal heart sounds.   No murmur heard. Pulses:      Radial pulses are 2+ on the left side.  Pulmonary/Chest: Effort normal and breath sounds normal. No respiratory distress. She has no wheezes. She has no rales.  Musculoskeletal: Normal range of motion.  Neurological: She is alert and oriented to person, place, and time.  Skin: Skin is warm and dry.  3cm diameter abscess on the left axilla.  Mild surrounding induration.  No surrounding erythema or warmth.  No drainage.    Psychiatric: She has a normal mood and affect. Her behavior is normal.    ED Course  Procedures (including critical care time)  DIAGNOSTIC STUDIES: Oxygen Saturation is 100% on room air, normal by my interpretation.    COORDINATION OF CARE: 9:53 AM- Will incise and drain the abscess in the ED.  The patient agreed to the treatment plan.  INCISION AND DRAINAGE PROCEDURE NOTE: Patient identification was confirmed and verbal consent was obtained. This procedure was performed by Audree Camel, MD at 9:53 AM. Site: left axilla Sterile procedures observed Needle size: 25 Guage Anesthetic used (type and amt): 5 mL Drainage: copious Complexity: Complex Site anesthetized, incision made over site, wound drained and explored loculations, rinsed with copious amounts of normal saline, wound packed with sterile gauze, covered with dry, sterile dressing.  Pt tolerated procedure well without complications.  Instructions for care discussed  verbally and pt provided with additional written instructions for homecare and f/u.    Labs Review Labs Reviewed - No data to display  Imaging Review No results found.   EKG Interpretation None      MDM   Final diagnoses:  None   Patient with skin abscess amenable to incision and drainage.   Encouraged home warm compresses.  Mild signs of cellulitis is surrounding skin.  Will d/c to home.  Patient stable for discharge.  Instructed to follow up in 2 days for recheck.  Return precautions given.  Patient also found to be hypertensive, but is asymptomatic from this.  She reports that she ran out of her HCTZ.  Patient given Rx for HCTZ and instructed to follow up with PCP.      Santiago Glad, PA-C 04/24/14 [phone]

## 2014-04-24 NOTE — ED Notes (Signed)
Pt to ED c/o boil under left arm x 2 weeks. Reports running out of dial soap which usually helps control boils.

## 2014-04-24 NOTE — Discharge Instructions (Signed)
Do not drive or operate heavy machinery for 4-6 hours after taking pain medication. °

## 2014-04-25 NOTE — ED Provider Notes (Signed)
Medical screening examination/treatment/procedure(s) were performed by non-physician practitioner and as supervising physician I was immediately available for consultation/collaboration.  Audree Camel, MD 04/25/14 [phone]

## 2014-08-09 ENCOUNTER — Encounter (HOSPITAL_COMMUNITY): Payer: Self-pay | Admitting: Family Medicine

## 2014-08-09 ENCOUNTER — Emergency Department (HOSPITAL_COMMUNITY)
Admission: EM | Admit: 2014-08-09 | Discharge: 2014-08-09 | Disposition: A | Discharge status: Home / Self Care | Payer: Self-pay | Attending: Emergency Medicine | Admitting: Emergency Medicine

## 2014-08-09 DIAGNOSIS — I10 Essential (primary) hypertension: Secondary | ICD-10-CM | POA: Insufficient documentation

## 2014-08-09 DIAGNOSIS — Z792 Long term (current) use of antibiotics: Secondary | ICD-10-CM | POA: Insufficient documentation

## 2014-08-09 DIAGNOSIS — Z8619 Personal history of other infectious and parasitic diseases: Secondary | ICD-10-CM | POA: Insufficient documentation

## 2014-08-09 DIAGNOSIS — Z87440 Personal history of urinary (tract) infections: Secondary | ICD-10-CM | POA: Insufficient documentation

## 2014-08-09 DIAGNOSIS — Z79899 Other long term (current) drug therapy: Secondary | ICD-10-CM | POA: Insufficient documentation

## 2014-08-09 DIAGNOSIS — L02412 Cutaneous abscess of left axilla: Secondary | ICD-10-CM | POA: Insufficient documentation

## 2014-08-09 DIAGNOSIS — E669 Obesity, unspecified: Secondary | ICD-10-CM | POA: Insufficient documentation

## 2014-08-09 DIAGNOSIS — L0291 Cutaneous abscess, unspecified: Secondary | ICD-10-CM

## 2014-08-09 HISTORY — DX: Essential (primary) hypertension: I10

## 2014-08-09 MED ORDER — TRAMADOL HCL 50 MG PO TABS
50.0000 mg | ORAL_TABLET | Freq: Once | ORAL | Status: AC
  Administered 2014-08-09: 50 mg via ORAL
  Filled 2014-08-09: qty 1

## 2014-08-09 MED ORDER — TRAMADOL HCL 50 MG PO TABS
50.0000 mg | ORAL_TABLET | Freq: Four times a day (QID) | ORAL | Status: DC | PRN
Start: 2014-08-09 — End: 2015-04-20

## 2014-08-09 MED ORDER — LIDOCAINE-EPINEPHRINE (PF) 2 %-1:200000 IJ SOLN
10.0000 mL | Freq: Once | INTRAMUSCULAR | Status: AC
  Administered 2014-08-09: 10 mL
  Filled 2014-08-09: qty 20

## 2014-08-09 NOTE — ED Notes (Signed)
Pt here fr abscess under left arm. Hx of same sts there since Thankgiving.

## 2014-08-09 NOTE — ED Provider Notes (Signed)
CSN: [id number]     Arrival date & time 08/09/14  0829 History  This chart was scribed for non-physician practitioner, Marlon Peliffany Takuma Cifelli, PA-C working with Rolland PorterMark James, MD, by Jarvis Morganaylor Ferguson, ED Scribe. This patient was seen in room TR11C/TR11C and the patient's care was started at 9:06 AM.    Chief Complaint  Patient presents with  . Abscess   The history is provided by the patient.    HPI Comments: Colleen Garrett is a 28 y.o. female with a h/o chlamydia, HTN, and obesity who presents to the Emergency Department complaining of an abscess under her left arm for 1 month. Pt states she has had an abscess in this area in the past. Pt states she last had an abscess under her left arm in September. She notes that she switched her body wash to try to prevent the abscesses but recently changed back to previous soap from occurring but it still came back. Reports she will return to her Dial soap. Not a diabetic.  She denies any diaphoresis, polyuria, increased thirst, fevers, weakness, nausea, vomiting or diarrhea.   Past Medical History  Diagnosis Date  . Chlamydia   . Obesity   . UTI (lower urinary tract infection)     proteus UTI  . Hypertension    History reviewed. No pertinent past surgical history. History reviewed. No pertinent family history. History  Substance Use Topics  . Smoking status: Never Smoker   . Smokeless tobacco: Not on file  . Alcohol Use: No   OB History    No data available     Review of Systems  Skin: Positive for color change (abscess under left arm).  All other systems reviewed and are negative.   Allergies  Review of patient's allergies indicates no known allergies.  Home Medications   Prior to Admission medications   Medication Sig Start Date End Date Taking? Authorizing Provider  hydrochlorothiazide (HYDRODIURIL) 25 MG tablet Take 1 tablet (25 mg total) by mouth daily. 09/14/13  Yes Rodolph BongEvan S Corey, MD  hydrochlorothiazide (HYDRODIURIL) 25 MG tablet Take 1  tablet (25 mg total) by mouth daily. Patient not taking: Reported on 08/09/2014 04/24/14   Santiago GladHeather Laisure, PA-C  HYDROcodone-acetaminophen (NORCO/VICODIN) 5-325 MG per tablet Take 1-2 tablets by mouth every 6 (six) hours as needed. Patient taking differently: Take 1-2 tablets by mouth every 6 (six) hours as needed (pain).  04/24/14   [name], PA-C  sulfamethoxazole-trimethoprim (SEPTRA DS) 800-160 MG per tablet Take 2 tablets by mouth 2 (two) times daily. Patient not taking: Reported on 08/09/2014 04/24/14   Santiago GladHeather Laisure, PA-C   Triage Vitals: BP 159/119 mmHg  Pulse 94  Temp(Src) 98.3 F (36.8 C)  Resp 18  SpO2 98%  LMP 07/04/2014  Physical Exam  Constitutional: She is oriented to person, place, and time. She appears well-developed and well-nourished. No distress.  HENT:  Head: Normocephalic and atraumatic.  Eyes: Conjunctivae and EOM are normal.  Neck: Neck supple. No tracheal deviation present.  Cardiovascular: Normal rate.   Pulmonary/Chest: Effort normal. No respiratory distress.  Musculoskeletal: Normal range of motion.  Neurological: She is alert and oriented to person, place, and time.  Skin: Skin is warm and dry.     Psychiatric: She has a normal mood and affect. Her behavior is normal.  Nursing note and vitals reviewed.   ED Course  Procedures (including critical care time)  DIAGNOSTIC STUDIES: Oxygen Saturation is 98% on RA, normal by my interpretation.    COORDINATION OF  CARE:  9:16 AM INCISION AND DRAINAGE  Performed by: Dorthula MatasGREENE,Krystl Wickware G  Authorized by: Dorthula MatasGREENE,Asmara Backs G   Consent - Verbal Consent obtained Risks and benefits: risks/benefits and alternatives were discussed  Type: Abscess  Body Area: left axilla  Anesthesia: Local infiltration Local anesthetic: lidocaine 1% w/ epinephrine  Anesthetic total: 2 ml  Complexity: Complex  Blunt dissection to break up loculations  Drainage: Purulent  Drainage amount: copious approx 100  ml  Patient tolerance: Patient tolerated the procedure well with no immediate complications   Labs Review Labs Reviewed - No data to display  Imaging Review No results found.   EKG Interpretation None      MDM   Final diagnoses:  Essential hypertension  Abscess   Successful I&D. Very large amount of puss removed. No associated abx. Advised to return to Dial soap since it was working. Referral to derm given. Recommend that she go to her PCP or dermatology sooner for abscess next time than waiting for it to get this big.  Pt has taken her BP medications this morning, reports it normally looks better she is nervous.  28 y.o.Colleen Garrett's evaluation in the Emergency Department is complete. It has been determined that no acute conditions requiring further emergency intervention are present at this time. The patient/guardian have been advised of the diagnosis and plan. We have discussed signs and symptoms that warrant return to the ED, such as changes or worsening in symptoms.  Vital signs are stable at discharge. Filed Vitals:   08/09/14 0837  BP: 159/119  Pulse:   Temp:   Resp:     Patient/guardian has voiced understanding and agreed to follow-up with the PCP or specialist.    Dorthula Matasiffany G Brody Bonneau, PA-C 08/09/14 [id number]  Rolland PorterMark James, MD 08/11/14 [phone]

## 2014-08-09 NOTE — ED Notes (Signed)
BP reported to PA Neva SeatGreene. Pt reports she has not taken her BP meds this am. Instructed to go home and take it.

## 2014-08-09 NOTE — Discharge Instructions (Signed)

## 2014-09-22 ENCOUNTER — Encounter (HOSPITAL_COMMUNITY): Payer: Self-pay | Admitting: Emergency Medicine

## 2014-09-22 ENCOUNTER — Emergency Department (HOSPITAL_COMMUNITY)
Admission: EM | Admit: 2014-09-22 | Discharge: 2014-09-22 | Disposition: A | Discharge status: Home / Self Care | Payer: Self-pay | Attending: Emergency Medicine | Admitting: Emergency Medicine

## 2014-09-22 DIAGNOSIS — S299XXA Unspecified injury of thorax, initial encounter: Secondary | ICD-10-CM | POA: Insufficient documentation

## 2014-09-22 DIAGNOSIS — Y9389 Activity, other specified: Secondary | ICD-10-CM | POA: Insufficient documentation

## 2014-09-22 DIAGNOSIS — Y998 Other external cause status: Secondary | ICD-10-CM | POA: Insufficient documentation

## 2014-09-22 DIAGNOSIS — E669 Obesity, unspecified: Secondary | ICD-10-CM | POA: Insufficient documentation

## 2014-09-22 DIAGNOSIS — Y92410 Unspecified street and highway as the place of occurrence of the external cause: Secondary | ICD-10-CM | POA: Insufficient documentation

## 2014-09-22 DIAGNOSIS — Z87440 Personal history of urinary (tract) infections: Secondary | ICD-10-CM | POA: Insufficient documentation

## 2014-09-22 DIAGNOSIS — Z79899 Other long term (current) drug therapy: Secondary | ICD-10-CM | POA: Insufficient documentation

## 2014-09-22 DIAGNOSIS — I158 Other secondary hypertension: Secondary | ICD-10-CM | POA: Insufficient documentation

## 2014-09-22 DIAGNOSIS — Z8619 Personal history of other infectious and parasitic diseases: Secondary | ICD-10-CM | POA: Insufficient documentation

## 2014-09-22 LAB — I-STAT CHEM 8, ED
BUN: 7 mg/dL (ref 6–23)
CALCIUM ION: 1.12 mmol/L (ref 1.12–1.23)
CHLORIDE: 103 mmol/L (ref 96–112)
CREATININE: 0.7 mg/dL (ref 0.50–1.10)
GLUCOSE: 92 mg/dL (ref 70–99)
HEMATOCRIT: 32 % — AB (ref 36.0–46.0)
Hemoglobin: 10.9 g/dL — ABNORMAL LOW (ref 12.0–15.0)
Potassium: 3.3 mmol/L — ABNORMAL LOW (ref 3.5–5.1)
SODIUM: 140 mmol/L (ref 135–145)
TCO2: 22 mmol/L (ref 0–100)

## 2014-09-22 MED ORDER — METHOCARBAMOL 750 MG PO TABS: 750.0000 mg | ORAL_TABLET | Freq: Four times a day (QID) | ORAL | Status: DC

## 2014-09-22 MED ORDER — CLONIDINE HCL 0.2 MG PO TABS
0.2000 mg | ORAL_TABLET | Freq: Once | ORAL | Status: AC
  Administered 2014-09-22: 0.2 mg via ORAL
  Filled 2014-09-22: qty 1

## 2014-09-22 MED ORDER — CLONIDINE HCL 0.1 MG PO TABS: 0.1000 mg | ORAL_TABLET | Freq: Once | ORAL | Status: DC

## 2014-09-22 MED ORDER — DIAZEPAM 5 MG PO TABS
5.0000 mg | ORAL_TABLET | Freq: Once | ORAL | Status: AC
  Administered 2014-09-22: 5 mg via ORAL
  Filled 2014-09-22: qty 1
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
## 2014-09-22 NOTE — ED Notes (Signed)
Called X #3 unable to locate patient at this time
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
## 2015-04-20 ENCOUNTER — Ambulatory Visit (INDEPENDENT_AMBULATORY_CARE_PROVIDER_SITE_OTHER): Payer: 59 | Admitting: Family Medicine

## 2015-04-20 VITALS — BP 160/111 | HR 99 | Temp 98.3°F | Resp 16 | Ht 66.25 in | Wt 346.0 lb

## 2015-04-20 DIAGNOSIS — I10 Essential (primary) hypertension: Secondary | ICD-10-CM | POA: Diagnosis not present

## 2015-04-20 DIAGNOSIS — Z9119 Patient's noncompliance with other medical treatment and regimen: Secondary | ICD-10-CM

## 2015-04-20 DIAGNOSIS — E669 Obesity, unspecified: Secondary | ICD-10-CM | POA: Diagnosis not present

## 2015-04-20 DIAGNOSIS — L02412 Cutaneous abscess of left axilla: Secondary | ICD-10-CM

## 2015-04-20 DIAGNOSIS — Z91199 Patient's noncompliance with other medical treatment and regimen due to unspecified reason: Secondary | ICD-10-CM

## 2015-04-20 MED ORDER — DOXYCYCLINE HYCLATE 100 MG PO TABS: 100.0000 mg | ORAL_TABLET | Freq: Two times a day (BID) | ORAL | Status: DC

## 2015-04-20 NOTE — Progress Notes (Signed)
Procedure Consent obtained. Iodine prep. 1 cc 1% local anesthesia. 1 1/2 cm incision made. Copious amount of purulence expressed. Wound culture obtained. Wound irrigated and explored. Small amount of necrotic tissue removed. 1/2 plain packing and clean dressing placed. Care instructions discussed.

## 2015-04-20 NOTE — Progress Notes (Addendum)
Subjective:  This chart was scribed for Colleen Staggers, MD by [facility], medical scribe at [facility].The patient was seen in exam room 14 and the patient's care was started at 9:50 AM.   Patient ID: Colleen Garrett, female    DOB: 06/18/1986, 29 y.o.   MRN: [id number] Chief Complaint  Patient presents with  . Recurrent Skin Infections    left arm   HPI HPI Comments: Colleen Garrett is a 29 y.o. female who presents to [facility] complaining of recurrent skin infections. Multiple abscesses under her left arm at the axilla, onset four days ago, Abscesses has rapidly grown since onset. No active discharge. Tried heating the area once an hour over the past two days. No fever and chills. Last abscess was 11 days ago. This resolved on its own.  HTN: HCTZ, does miss doses. Taking 3 days a week, she is very forgetful. Medication is left in her car. She does not check her blood pressure outside the office. No chest pain, dizziness, lightheadedness, sob. No history of diabetes.   No PCP, appointment Dr. Concepcion Elk on September 22 nd  There are no active problems to display for this patient.  Past Medical History  Diagnosis Date  . Chlamydia   . Obesity   . UTI (lower urinary tract infection)     proteus UTI  . Hypertension    History reviewed. No pertinent past surgical history. No Known Allergies Prior to Admission medications   Medication Sig Start Date End Date Taking? Authorizing Provider  hydrochlorothiazide (HYDRODIURIL) 25 MG tablet Take 1 tablet (25 mg total) by mouth daily. 04/24/14  Yes [name], PA-C  acetaminophen (TYLENOL) 325 MG tablet Take 650 mg by mouth every 6 (six) hours as needed (cramping).    Historical Provider, MD   Social History   Social History  . Marital Status: Single    Spouse Name: N/A  . Number of Children: N/A  . Years of Education: N/A   Occupational History  . Not on file.   Social History Main  Topics  . Smoking status: Never Smoker   . Smokeless tobacco: Not on file  . Alcohol Use: No  . Drug Use: No  . Sexual Activity: Yes    Birth Control/ Protection: None     Comment: last contact 4.17.2013   Other Topics Concern  . Not on file   Social History Narrative   Review of Systems  Constitutional: Negative for fever and chills.  Skin:       Positive of Abscess.  Neurological: Negative for dizziness and light-headedness.       Objective:  BP 151/113 mmHg  Pulse 99  Temp(Src) 98.3 F (36.8 C) (Oral)  Resp 16  Ht 5' 6.25" (1.683 m)  LMP 04/16/2015 Physical Exam  Constitutional: She is oriented to person, place, and time. She appears well-developed and well-nourished. No distress.  HENT:  Head: Normocephalic and atraumatic.  Eyes: Conjunctivae and EOM are normal. Pupils are equal, round, and reactive to light.  Neck: Normal range of motion. Carotid bruit is not present.  Cardiovascular: Normal rate, regular rhythm, normal heart sounds and intact distal pulses.   Pulmonary/Chest: Effort normal and breath sounds normal. No respiratory distress.  Abdominal: Soft. She exhibits no pulsatile midline mass. There is no tenderness.  Musculoskeletal: Normal range of motion.  Neurological: She is alert and oriented to person, place, and time.  Non focal neuro exam.  No  focal weakness. Negative romberg.  Skin: Skin is warm and dry.  Multiple areas Indurated area under left axilla with central fluctuance, largest being in the upper arm. Induration is roughly 3-4 cm. Lower lesions are 1-2 cm of induration. No active discharge. Scaring from previous lesions.  Psychiatric: She has a normal mood and affect. Her behavior is normal.  Nursing note and vitals reviewed.     Assessment & Plan:    [name] is a 29 y.o. female Abscess of axilla, left - Plan: doxycycline (VIBRA-TABS) 100 MG tablet, Wound culture  -I and D as per procedure note, wound cx obtained, doxycycline  100mg  BID. Wound care follow up as planned.   Obesity  -weight loss briefly discussed, has planned follow up with PCP to discuss further.   Essential hypertension, History of nonadherence to medical treatment   -stressed importance of med adherence, and risks of nontreatment including but not limited to CVD, heart disease, kidney disease.    -recommended changing location of meds to near toothbrush or cell phone to help to remember taking it. Check outside BP's and follow up with PCP to determine if med changes needed.   Meds ordered this encounter  Medications  . doxycycline (VIBRA-TABS) 100 MG tablet    Sig: Take 1 tablet (100 mg total) by mouth 2 (two) times daily.    Dispense:  20 tablet    Refill:  0   Patient Instructions  Make sure you take her blood pressure medicine every day, as elevated pressure today may be due to the previous missed days. Check her blood pressure outside of the visit and keep a record for your follow-up with primary care provider in 12 days as planned. If any chest pains, headaches, blurry vision, weakness, or new symptoms, return here, emergency room, or 911 if needed  Follow-up with your primary care provider to discuss different techniques for weight loss. Increasing activity to start, watching portion sizes with diet, avoiding fast food and sugar containing beverages such as sodas and iced tea are a good start. Let me know if we can help further.  For the abscess on your arm, start doxycycline 1 pill twice per day. Warm compresses, and in the future if these do occur, warm compresses multiple times per day may allow them to open up early.  Return to the clinic or go to the nearest emergency room if any of your symptoms worsen or new symptoms occur.   Abscess An abscess is an infected area that contains a collection of pus and debris.It can occur in almost any part of the body. An abscess is also known as a furuncle or boil. CAUSES  An abscess occurs  when tissue gets infected. This can occur from blockage of oil or sweat glands, infection of hair follicles, or a minor injury to the skin. As the body tries to fight the infection, pus collects in the area and creates pressure under the skin. This pressure causes pain. People with weakened immune systems have difficulty fighting infections and get certain abscesses more often.  SYMPTOMS Usually an abscess develops on the skin and becomes a painful mass that is red, warm, and tender. If the abscess forms under the skin, you may feel a moveable soft area under the skin. Some abscesses break open (rupture) on their own, but most will continue to get worse without care. The infection can spread deeper into the body and eventually into the bloodstream, causing you to feel ill.  DIAGNOSIS  Your caregiver will take your medical history and perform a physical exam. A sample of fluid may also be taken from the abscess to determine what is causing your infection. TREATMENT  Your caregiver may prescribe antibiotic medicines to fight the infection. However, taking antibiotics alone usually does not cure an abscess. Your caregiver may need to make a small cut (incision) in the abscess to drain the pus. In some cases, gauze is packed into the abscess to reduce pain and to continue draining the area. HOME CARE INSTRUCTIONS   Only take over-the-counter or prescription medicines for pain, discomfort, or fever as directed by your caregiver.  If you were prescribed antibiotics, take them as directed. Finish them even if you start to feel better.  If gauze is used, follow your caregiver's directions for changing the gauze.  To avoid spreading the infection:  Keep your draining abscess covered with a bandage.  Wash your hands well.  Do not share personal care items, towels, or whirlpools with others.  Avoid skin contact with others.  Keep your skin and clothes clean around the abscess.  Keep all follow-up  appointments as directed by your caregiver. SEEK MEDICAL CARE IF:   You have increased pain, swelling, redness, fluid drainage, or bleeding.  You have muscle aches, chills, or a general ill feeling.  You have a fever. MAKE SURE YOU:   Understand these instructions.  Will watch your condition.  Will get help right away if you are not doing well or get worse. Document Released: 05/06/2005 Document Revised: 01/26/2012 Document Reviewed: 10/09/2011 [facility] Patient Information 2015 [place], [place]. This information is not intended to replace advice given to you by your health care provider. Make sure you discuss any questions you have with your health care provider. Hypertension Hypertension, commonly called high blood pressure, is when the force of blood pumping through your arteries is too strong. Your arteries are the blood vessels that carry blood from your heart throughout your body. A blood pressure reading consists of a higher number over a lower number, such as 110/72. The higher number (systolic) is the pressure inside your arteries when your heart pumps. The lower number (diastolic) is the pressure inside your arteries when your heart relaxes. Ideally you want your blood pressure below 120/80. Hypertension forces your heart to work harder to pump blood. Your arteries may become narrow or stiff. Having hypertension puts you at risk for heart disease, stroke, and other problems.  RISK FACTORS Some risk factors for high blood pressure are controllable. Others are not.  Risk factors you cannot control include:   Race. You may be at higher risk if you are African American.  Age. Risk increases with age.  Gender. Men are at higher risk than women before age 57 years. After age 84, women are at higher risk than men. Risk factors you can control include:  Not getting enough exercise or physical activity.  Being overweight.  Getting too much fat, sugar, calories, or salt in your  diet.  Drinking too much alcohol. SIGNS AND SYMPTOMS Hypertension does not usually cause signs or symptoms. Extremely high blood pressure (hypertensive crisis) may cause headache, anxiety, shortness of breath, and nosebleed. DIAGNOSIS  To check if you have hypertension, your health care provider will measure your blood pressure while you are seated, with your arm held at the level of your heart. It should be measured at least twice using the same arm. Certain conditions can cause a difference in blood pressure between your right  and left arms. A blood pressure reading that is higher than normal on one occasion does not mean that you need treatment. If one blood pressure reading is high, ask your health care provider about having it checked again. TREATMENT  Treating high blood pressure includes making lifestyle changes and possibly taking medicine. Living a healthy lifestyle can help lower high blood pressure. You may need to change some of your habits. Lifestyle changes may include:  Following the DASH diet. This diet is high in fruits, vegetables, and whole grains. It is low in salt, red meat, and added sugars.  Getting at least 2 hours of brisk physical activity every week.  Losing weight if necessary.  Not smoking.  Limiting alcoholic beverages.  Learning ways to reduce stress. If lifestyle changes are not enough to get your blood pressure under control, your health care provider may prescribe medicine. You may need to take more than one. Work closely with your health care provider to understand the risks and benefits. HOME CARE INSTRUCTIONS  Have your blood pressure rechecked as directed by your health care provider.   Take medicines only as directed by your health care provider. Follow the directions carefully. Blood pressure medicines must be taken as prescribed. The medicine does not work as well when you skip doses. Skipping doses also puts you at risk for problems.   Do not  smoke.   Monitor your blood pressure at home as directed by your health care provider. SEEK MEDICAL CARE IF:   You think you are having a reaction to medicines taken.  You have recurrent headaches or feel dizzy.  You have swelling in your ankles.  You have trouble with your vision. SEEK IMMEDIATE MEDICAL CARE IF:  You develop a severe headache or confusion.  You have unusual weakness, numbness, or feel faint.  You have severe chest or abdominal pain.  You vomit repeatedly.  You have trouble breathing. MAKE SURE YOU:   Understand these instructions.  Will watch your condition.  Will get help right away if you are not doing well or get worse. Document Released: 07/27/2005 Document Revised: 12/11/2013 Document Reviewed: 05/19/2013 [facility] Patient Information 2015 [place], [place]. This information is not intended to replace advice given to you by your health care provider. Make sure you discuss any questions you have with your health care provider.      I personally performed the services described in this documentation, which was scribed in my presence. The recorded information has been reviewed and considered, and addended by me as needed.

## 2015-04-20 NOTE — Patient Instructions (Signed)
Make sure you take her blood pressure medicine every day, as elevated pressure today may be due to the previous missed days. Check her blood pressure outside of the visit and keep a record for your follow-up with primary care provider in 12 days as planned. If any chest pains, headaches, blurry vision, weakness, or new symptoms, return here, emergency room, or 911 if needed  Follow-up with your primary care provider to discuss different techniques for weight loss. Increasing activity to start, watching portion sizes with diet, avoiding fast food and sugar containing beverages such as sodas and iced tea are a good start. Let me know if we can help further.  For the abscess on your arm, start doxycycline 1 pill twice per day. Warm compresses, and in the future if these do occur, warm compresses multiple times per day may allow them to open up early.  Return to the clinic or go to the nearest emergency room if any of your symptoms worsen or new symptoms occur.   Abscess An abscess is an infected area that contains a collection of pus and debris.It can occur in almost any part of the body. An abscess is also known as a furuncle or boil. CAUSES  An abscess occurs when tissue gets infected. This can occur from blockage of oil or sweat glands, infection of hair follicles, or a minor injury to the skin. As the body tries to fight the infection, pus collects in the area and creates pressure under the skin. This pressure causes pain. People with weakened immune systems have difficulty fighting infections and get certain abscesses more often.  SYMPTOMS Usually an abscess develops on the skin and becomes a painful mass that is red, warm, and tender. If the abscess forms under the skin, you may feel a moveable soft area under the skin. Some abscesses break open (rupture) on their own, but most will continue to get worse without care. The infection can spread deeper into the body and eventually into the  bloodstream, causing you to feel ill.  DIAGNOSIS  Your caregiver will take your medical history and perform a physical exam. A sample of fluid may also be taken from the abscess to determine what is causing your infection. TREATMENT  Your caregiver may prescribe antibiotic medicines to fight the infection. However, taking antibiotics alone usually does not cure an abscess. Your caregiver may need to make a small cut (incision) in the abscess to drain the pus. In some cases, gauze is packed into the abscess to reduce pain and to continue draining the area. HOME CARE INSTRUCTIONS   Only take over-the-counter or prescription medicines for pain, discomfort, or fever as directed by your caregiver.  If you were prescribed antibiotics, take them as directed. Finish them even if you start to feel better.  If gauze is used, follow your caregiver's directions for changing the gauze.  To avoid spreading the infection:  Keep your draining abscess covered with a bandage.  Wash your hands well.  Do not share personal care items, towels, or whirlpools with others.  Avoid skin contact with others.  Keep your skin and clothes clean around the abscess.  Keep all follow-up appointments as directed by your caregiver. SEEK MEDICAL CARE IF:   You have increased pain, swelling, redness, fluid drainage, or bleeding.  You have muscle aches, chills, or a general ill feeling.  You have a fever. MAKE SURE YOU:   Understand these instructions.  Will watch your condition.  Will get help right  away if you are not doing well or get worse. Document Released: 05/06/2005 Document Revised: 01/26/2012 Document Reviewed: 10/09/2011 [facility] Patient Information 2015 [place], [place]. This information is not intended to replace advice given to you by your health care provider. Make sure you discuss any questions you have with your health care provider. Hypertension Hypertension, commonly called high blood  pressure, is when the force of blood pumping through your arteries is too strong. Your arteries are the blood vessels that carry blood from your heart throughout your body. A blood pressure reading consists of a higher number over a lower number, such as 110/72. The higher number (systolic) is the pressure inside your arteries when your heart pumps. The lower number (diastolic) is the pressure inside your arteries when your heart relaxes. Ideally you want your blood pressure below 120/80. Hypertension forces your heart to work harder to pump blood. Your arteries may become narrow or stiff. Having hypertension puts you at risk for heart disease, stroke, and other problems.  RISK FACTORS Some risk factors for high blood pressure are controllable. Others are not.  Risk factors you cannot control include:   Race. You may be at higher risk if you are African American.  Age. Risk increases with age.  Gender. Men are at higher risk than women before age 53 years. After age 53, women are at higher risk than men. Risk factors you can control include:  Not getting enough exercise or physical activity.  Being overweight.  Getting too much fat, sugar, calories, or salt in your diet.  Drinking too much alcohol. SIGNS AND SYMPTOMS Hypertension does not usually cause signs or symptoms. Extremely high blood pressure (hypertensive crisis) may cause headache, anxiety, shortness of breath, and nosebleed. DIAGNOSIS  To check if you have hypertension, your health care provider will measure your blood pressure while you are seated, with your arm held at the level of your heart. It should be measured at least twice using the same arm. Certain conditions can cause a difference in blood pressure between your right and left arms. A blood pressure reading that is higher than normal on one occasion does not mean that you need treatment. If one blood pressure reading is high, ask your health care provider about having it  checked again. TREATMENT  Treating high blood pressure includes making lifestyle changes and possibly taking medicine. Living a healthy lifestyle can help lower high blood pressure. You may need to change some of your habits. Lifestyle changes may include:  Following the DASH diet. This diet is high in fruits, vegetables, and whole grains. It is low in salt, red meat, and added sugars.  Getting at least 2 hours of brisk physical activity every week.  Losing weight if necessary.  Not smoking.  Limiting alcoholic beverages.  Learning ways to reduce stress. If lifestyle changes are not enough to get your blood pressure under control, your health care provider may prescribe medicine. You may need to take more than one. Work closely with your health care provider to understand the risks and benefits. HOME CARE INSTRUCTIONS  Have your blood pressure rechecked as directed by your health care provider.   Take medicines only as directed by your health care provider. Follow the directions carefully. Blood pressure medicines must be taken as prescribed. The medicine does not work as well when you skip doses. Skipping doses also puts you at risk for problems.   Do not smoke.   Monitor your blood pressure at home as directed by  your health care provider. SEEK MEDICAL CARE IF:   You think you are having a reaction to medicines taken.  You have recurrent headaches or feel dizzy.  You have swelling in your ankles.  You have trouble with your vision. SEEK IMMEDIATE MEDICAL CARE IF:  You develop a severe headache or confusion.  You have unusual weakness, numbness, or feel faint.  You have severe chest or abdominal pain.  You vomit repeatedly.  You have trouble breathing. MAKE SURE YOU:   Understand these instructions.  Will watch your condition.  Will get help right away if you are not doing well or get worse. Document Released: 07/27/2005 Document Revised: 12/11/2013  Document Reviewed: 05/19/2013 [facility] Patient Information 2015 [place], [place]. This information is not intended to replace advice given to you by your health care provider. Make sure you discuss any questions you have with your health care provider.

## 2015-04-22 LAB — WOUND CULTURE: Gram Stain: NONE SEEN

## 2016-09-05 ENCOUNTER — Emergency Department (HOSPITAL_COMMUNITY)
Admission: EM | Admit: 2016-09-05 | Discharge: 2016-09-05 | Disposition: A | Discharge status: Home / Self Care | Payer: Self-pay | Attending: Emergency Medicine | Admitting: Emergency Medicine

## 2016-09-05 ENCOUNTER — Encounter (HOSPITAL_COMMUNITY): Payer: Self-pay | Admitting: Emergency Medicine

## 2016-09-05 DIAGNOSIS — L0291 Cutaneous abscess, unspecified: Secondary | ICD-10-CM

## 2016-09-05 DIAGNOSIS — L02412 Cutaneous abscess of left axilla: Secondary | ICD-10-CM | POA: Insufficient documentation

## 2016-09-05 DIAGNOSIS — Z79899 Other long term (current) drug therapy: Secondary | ICD-10-CM | POA: Insufficient documentation

## 2016-09-05 DIAGNOSIS — I10 Essential (primary) hypertension: Secondary | ICD-10-CM | POA: Insufficient documentation

## 2016-09-05 MED ORDER — OXYCODONE-ACETAMINOPHEN 5-325 MG PO TABS
1.0000 | ORAL_TABLET | Freq: Once | ORAL | Status: AC
  Administered 2016-09-05: 1 via ORAL
  Filled 2016-09-05: qty 1

## 2016-09-05 MED ORDER — SULFAMETHOXAZOLE-TRIMETHOPRIM 800-160 MG PO TABS: 1.0000 | ORAL_TABLET | Freq: Two times a day (BID) | ORAL | 0 refills | Status: AC

## 2016-09-05 MED ORDER — IBUPROFEN 400 MG PO TABS
600.0000 mg | ORAL_TABLET | Freq: Once | ORAL | Status: DC
  Filled 2016-09-05: qty 1

## 2016-09-05 MED ORDER — LIDOCAINE-EPINEPHRINE (PF) 2 %-1:200000 IJ SOLN
20.0000 mL | Freq: Once | INTRAMUSCULAR | Status: AC
  Administered 2016-09-05: 20 mL
  Filled 2016-09-05: qty 20

## 2016-09-05 NOTE — Discharge Instructions (Signed)
You were seen today for abscess. Drain was placed. Follow-up in 2 days for recheck either by her primary physician, urgent care or ER for packing removal. You will be placed on an antibiotic.  If you have any new or worsening symptoms you need to be reevaluated.

## 2016-09-05 NOTE — ED Triage Notes (Signed)
PA at bedside to drain abscess

## 2016-09-05 NOTE — ED Provider Notes (Signed)
Renne MuscaJaleesa K Luhrs is a 31 y.o. female here for abscess to the left axilla.   THIS IS A SHARED VISIT WITH DR. HORTON BP (!) 154/103 (BP Location: Left Arm)   Pulse 110   Temp 100 F (37.8 C) (Oral)   Resp 20   Wt (!) 169.2 kg   LMP 08/26/2016   SpO2 100%   BMI 59.75 kg/m    INCISION AND DRAINAGE Performed by: NEESE,HOPE Consent: Verbal consent obtained. Risks and benefits: risks, benefits and alternatives were discussed Type: abscess  Body area: left axilla  Anesthesia: local infiltration  Area cleaned with betadine  Local anesthetic: lidocaine 2% with epinephrine  Anesthetic total: 5 ml  Incision made with #11 blade, single straight  Complexity: complex Blunt dissection to break up loculations  Drainage: purulent  Drainage amount: large  Irrigated with NSS  Packing material: 1/4 in iodoform gauze  Patient tolerance: Patient tolerated the procedure well with no immediate complications.  Dr. Wilkie AyeHorton will d/c patient with instructions.     [address]Hope [place], [place] 09/05/16 1622    Shon Batonourtney F Horton, MD 09/05/16 1630

## 2016-09-05 NOTE — ED Triage Notes (Signed)
Pt. Stated, I have a abscess under left arm. Previous the same . I also have chills like I have a fever. I've had the fever in the past with a fever.

## 2016-09-05 NOTE — ED Notes (Signed)
Declined W/C at D/C and was escorted to lobby by RN. 

## 2016-09-05 NOTE — ED Provider Notes (Signed)
[facility] Provider Note   CSN: [id number] Arrival date & time: 09/05/16  1320   By signing my name below, I, [name], attest that this documentation has been prepared under the direction and in the presence of [name], MD. Electronically signed: [name], ED Scribe. 09/05/16. 4:27 PM.   History   Chief Complaint Chief Complaint  Patient presents with  . Abscess  . Chills    HPI  Colleen Garrett is a 31 y.o. female with hx of recurrent abscess who presents to the Emergency Department complaining of recurrent abscess with sudden onset x1 week. Pt states that x10 days ago she ran out of [place] soap and had to use [place]. x3 days after using [place] she developed an abscess under the L arm. Pt reports fever, chills. She rates the pain 10/10 with palpation and 5/10 w/o.   The history is provided by the patient. No language interpreter was used.    Past Medical History:  Diagnosis Date  . Chlamydia   . Hypertension   . Obesity   . UTI (lower urinary tract infection)    proteus UTI    There are no active problems to display for this patient.   History reviewed. No pertinent surgical history.  OB History    No data available       Home Medications    Prior to Admission medications   Medication Sig Start Date End Date Taking? Authorizing Provider  acetaminophen (TYLENOL) 325 MG tablet Take 650 mg by mouth every 6 (six) hours as needed (cramping).    Historical Provider, MD  doxycycline (VIBRA-TABS) 100 MG tablet Take 1 tablet (100 mg total) by mouth 2 (two) times daily. 04/20/15   Shade Flood, MD  hydrochlorothiazide (HYDRODIURIL) 25 MG tablet Take 1 tablet (25 mg total) by mouth daily. 04/24/14   [name], PA-C  sulfamethoxazole-trimethoprim (BACTRIM DS,SEPTRA DS) 800-160 MG tablet Take 1 tablet by mouth 2 (two) times daily. 09/05/16 09/12/16  [name], MD    Family History Family History  Problem  Relation Age of Onset  . Hypertension Mother   . Hypertension Father   . Hyperlipidemia Father     Social History Social History  Substance Use Topics  . Smoking status: Never Smoker  . Smokeless tobacco: Never Used  . Alcohol use No     Allergies   Patient has no known allergies.   Review of Systems Review of Systems  Constitutional: Positive for chills. Negative for fever.  Skin:       Abscess under L arm  All other systems reviewed and are negative.    Physical Exam Updated Vital Signs BP (!) 154/103 (BP Location: Left Arm)   Pulse 110   Temp 100 F (37.8 C) (Oral)   Resp 20   Wt (!) 373 lb (169.2 kg)   LMP 08/26/2016   SpO2 100%   BMI 59.75 kg/m   Physical Exam  Constitutional: She is oriented to person, place, and time. She appears well-developed and well-nourished.  obese  HENT:  Head: Normocephalic and atraumatic.  Cardiovascular: Normal rate, regular rhythm and normal heart sounds.   Pulmonary/Chest: Effort normal. No respiratory distress. She has no wheezes.  Neurological: She is alert and oriented to person, place, and time.  Skin: Skin is warm and dry.  Tenderness palpation left axilla, soft fluctuance, no overlying erythema or induration  Psychiatric: She has a normal mood and affect.  Nursing note  and vitals reviewed.    ED Treatments / Results   DIAGNOSTIC STUDIES: Oxygen Saturation is 100% on RA, normal by my interpretation.   COORDINATION OF CARE: 4:27 PM-Discussed next steps with pt. Pt verbalized understanding and is agreeable with the plan.    Labs (all labs ordered are listed, but only abnormal results are displayed) Labs Reviewed - No data to display  EKG  EKG Interpretation None       Radiology No results found.  Procedures Procedures (including critical care time)  Medications Ordered in ED Medications  ibuprofen (ADVIL,MOTRIN) tablet 600 mg (400 mg Oral Incomplete 09/05/16 1601)  lidocaine-EPINEPHrine  (XYLOCAINE W/EPI) 2 %-1:200000 (PF) injection 20 mL (20 mLs Infiltration Given 09/05/16 1538)  oxyCODONE-acetaminophen (PERCOCET/ROXICET) 5-325 MG per tablet 1 tablet (1 tablet Oral Given 09/05/16 1538)     Initial Impression / Assessment and Plan / ED Course  I have reviewed the triage vital signs and the nursing notes.  Pertinent labs & imaging results that were available during my care of the patient were reviewed by me and considered in my medical decision making (see chart for details).     Patient presents with tenderness under left arm. She has an area of fluctuance. History of abscess. Temperature 100.  Patient is otherwise nontoxic-appearing. No significant signs of cellulitis. Abscess drained by [name], NP. See additional note for details. We'll place on antibiotics given chills and temperature of 100. Bactrim given concern for MRSA. Recheck in 2 days for packing removal.  After history, exam, and medical workup I feel the patient has been appropriately medically screened and is safe for discharge home. Pertinent diagnoses were discussed with the patient. Patient was given return precautions.   Final Clinical Impressions(s) / ED Diagnoses   Final diagnoses:  Abscess    New Prescriptions New Prescriptions   SULFAMETHOXAZOLE-TRIMETHOPRIM (BACTRIM DS,SEPTRA DS) 800-160 MG TABLET    Take 1 tablet by mouth 2 (two) times daily.   I personally performed the services described in this documentation, which was scribed in my presence. The recorded information has been reviewed and is accurate.     Shon Batonourtney F Yazmen Briones, MD 09/05/16 1630

## 2019-01-04 ENCOUNTER — Encounter (HOSPITAL_COMMUNITY): Payer: Self-pay

## 2019-01-04 ENCOUNTER — Ambulatory Visit (HOSPITAL_COMMUNITY)
Admission: EM | Admit: 2019-01-04 | Discharge: 2019-01-04 | Disposition: A | Discharge status: Home / Self Care | Payer: Self-pay | Attending: Internal Medicine | Admitting: Internal Medicine

## 2019-01-04 ENCOUNTER — Other Ambulatory Visit: Payer: Self-pay

## 2019-01-04 DIAGNOSIS — R030 Elevated blood-pressure reading, without diagnosis of hypertension: Secondary | ICD-10-CM

## 2019-01-04 DIAGNOSIS — N611 Abscess of the breast and nipple: Secondary | ICD-10-CM

## 2019-01-04 DIAGNOSIS — L02412 Cutaneous abscess of left axilla: Secondary | ICD-10-CM

## 2019-01-04 MED ORDER — SULFAMETHOXAZOLE-TRIMETHOPRIM 800-160 MG PO TABS: 1.0000 | ORAL_TABLET | Freq: Two times a day (BID) | ORAL | 0 refills | Status: AC

## 2019-01-04 NOTE — ED Triage Notes (Signed)
Pt has a abscess on her chest for 4 days. Pt has a abscess underneath her left arm x 2 weeks. Pt has been using warm compresses.

## 2019-01-04 NOTE — Discharge Instructions (Addendum)
Keep areas clean and dry.  Use mild, unscented soap and pat the area dry.   Do NOT use antiperspirants - look for deodorants WITHOUT aluminum or antiperspirant in the ingredient list. Take antibiotics as instructed. Continue hot compresses 3 times daily.

## 2019-01-04 NOTE — ED Provider Notes (Signed)
[facility]    CSN: [id number] Arrival date & time: 01/04/19  1815     History   Chief Complaint Chief Complaint  Patient presents with  . Abscess    HPI Colleen Garrett is a 33 y.o. female presenting for acute concern of left axillary and left breast abscess.  Patient states her left axillary abscess has been there for 2 weeks, breast abscess has been there for "a couple days ".  Patient has tried hot compresses throughout the day, NSAIDs for pain relief.  States that pain has worsened over time, having difficulty sleeping.  Patient denies active discharge, warmth, fever, malaise.  Patient does have elevated temperature in office today.,  Patient has had recurrent abscesses in the same spot for the last few years.  Has not had surgical consult, interested in doing so.  Patient using gentle, unscented soaps, though has been showering more frequently during her flare.  Patient using antiperspirants.  None of note, patient's blood pressure is elevated in office today: Denies headache, change in vision, chest pain, palpitations, shortness of breath, lower extremity edema.  Is that her blood pressure usually is not this high, not on any antihypertensives.  States that "I feel fine ".    Past Medical History:  Diagnosis Date  . Chlamydia   . Hypertension   . Obesity   . UTI (lower urinary tract infection)    proteus UTI    There are no active problems to display for this patient.   History reviewed. No pertinent surgical history.  OB History   No obstetric history on file.      Home Medications    Prior to Admission medications   Medication Sig Start Date End Date Taking? Authorizing Provider  acetaminophen (TYLENOL) 325 MG tablet Take 650 mg by mouth every 6 (six) hours as needed (cramping).    [provider]  doxycycline (VIBRA-TABS) 100 MG tablet Take 1 tablet (100 mg total) by mouth 2 (two) times daily. 04/20/15   Shade Flood, MD   hydrochlorothiazide (HYDRODIURIL) 25 MG tablet Take 1 tablet (25 mg total) by mouth daily. 04/24/14   Santiago Glad, PA-C  sulfamethoxazole-trimethoprim (BACTRIM DS) 800-160 MG tablet Take 1 tablet by mouth 2 (two) times daily for 7 days. 01/04/19 01/11/19  Hall-Potvin, [place], PA-C    Family History Family History  Problem Relation Age of Onset  . Hypertension Mother   . Hypertension Father   . Hyperlipidemia Father     Social History Social History   Tobacco Use  . Smoking status: Never Smoker  . Smokeless tobacco: Never Used  Substance Use Topics  . Alcohol use: No  . Drug use: No     Allergies   Patient has no known allergies.   Review of Systems Review of Systems   Physical Exam Triage Vital Signs ED Triage Vitals  Enc Vitals Group     BP 01/04/19 1834 (!) 180/131     Pulse Rate 01/04/19 1834 85     Resp 01/04/19 1834 20     Temp 01/04/19 1834 100.1 F (37.8 C)     Temp Source 01/04/19 1834 Oral     SpO2 01/04/19 1834 98 %     Weight 01/04/19 1832 (!) 358 lb (162.4 kg)     Height --      Head Circumference --      Peak Flow --      Pain Score --      Pain Loc --  Pain Edu? --      Excl. in GC? --    No data found.  Updated Vital Signs BP (!) 180/131 (BP Location: Right Wrist)   Pulse 85   Temp 100.1 F (37.8 C) (Oral)   Resp 20   Wt (!) 358 lb (162.4 kg)   SpO2 98%   BMI 57.35 kg/m   Visual Acuity Right Eye Distance:   Left Eye Distance:   Bilateral Distance:    Right Eye Near:   Left Eye Near:    Bilateral Near:     Physical Exam Constitutional:      General: She is not in acute distress.    Appearance: She is obese.  Cardiovascular:     Rate and Rhythm: Normal rate.  Pulmonary:     Effort: Pulmonary effort is normal.  Skin:    Comments: Left axilla with large (greater than centimeters in diameter) indurated, tender lesion.  No fluctuancey appreciated.  Unable to appreciate depth/extent of abscess.   Left breast: 2 cm,  fluctuant lesion that is tender and warm to palpation.  No active discharge, underlying erythema is present.  Neurological:     Mental Status: She is alert.      UC Treatments / Results  Labs (all labs ordered are listed, but only abnormal results are displayed) Labs Reviewed - No data to display  EKG None  Radiology No results found.  Procedures Incision and Drainage Date/Time: 01/04/2019 7:46 PM Performed by: Shea EvansHall-Potvin, , PA-C Authorized by: Merrilee JanskyLamptey, Philip O, MD   Consent:    Consent obtained:  Verbal   Consent given by:  Patient   Risks discussed:  Bleeding, incomplete drainage, pain, damage to other organs and infection   Alternatives discussed:  No treatment Universal protocol:    Patient identity confirmed:  Verbally with patient Location:    Type:  Abscess   Location: left breast. Pre-procedure details:    Skin preparation:  Betadine Anesthesia (see MAR for exact dosages):    Anesthesia method:  Local infiltration   Local anesthetic:  Lidocaine 1% WITH epi and lidocaine 2% w/o epi Procedure type:    Complexity:  Simple Procedure details:    Needle aspiration: no     Incision types:  Single straight   Incision depth:  Subcutaneous   Scalpel blade:  11   Wound management:  Irrigated with saline   Drainage:  Purulent and bloody   Drainage amount:  Copious   Wound treatment:  Wound left open   Packing materials:  None Post-procedure details:    Patient tolerance of procedure:  Tolerated well, no immediate complications   (including critical care time)  Medications Ordered in UC Medications - No data to display  Initial Impression / Assessment and Plan / UC Course  I have reviewed the triage vital signs and the nursing notes.  Pertinent labs & imaging results that were available during my care of the patient were reviewed by me and considered in my medical decision making (see chart for details).     Pleasant 33 year old female with  recurrent left axillary and breast abscesses.  I&D performed on left breast abscess, will follow with oral antibiotics.  Surgery contact information given to patient to follow-up if symptoms worsen and/or to pursue further work-up/management.  Patient blood pressure elevated in office today, likely second to anxiety and pain.  Patient to establish care with PCP office as provided in discharge paperwork for further evaluation/management.  ER precautions provided, patient verbalized understanding.  Final Clinical Impressions(s) / UC Diagnoses   Final diagnoses:  Abscess of left axilla  Abscess of left breast  Elevated blood pressure reading in office without diagnosis of hypertension     Discharge Instructions     Keep areas clean and dry.  Use mild, unscented soap and pat the area dry.   Do NOT use antiperspirants - look for deodorants WITHOUT aluminum or antiperspirant in the ingredient list. Take antibiotics as instructed. Continue hot compresses 3 times daily.    ED Prescriptions    Medication Sig Dispense Auth. Provider   sulfamethoxazole-trimethoprim (BACTRIM DS) 800-160 MG tablet Take 1 tablet by mouth 2 (two) times daily for 7 days. 14 tablet Hall-Potvin, [place], PA-C     Controlled Substance Prescriptions [place] Controlled Substance Registry consulted? Not Applicable   Shea Evans, Cordelia Poche 01/04/19 2119

## 2019-01-07 ENCOUNTER — Encounter (HOSPITAL_COMMUNITY): Payer: Self-pay | Admitting: Family Medicine

## 2019-01-07 ENCOUNTER — Other Ambulatory Visit: Payer: Self-pay

## 2019-01-07 ENCOUNTER — Ambulatory Visit (HOSPITAL_COMMUNITY)
Admission: EM | Admit: 2019-01-07 | Discharge: 2019-01-07 | Disposition: A | Discharge status: Home / Self Care | Payer: BLUE CROSS/BLUE SHIELD | Attending: Family Medicine | Admitting: Family Medicine

## 2019-01-07 DIAGNOSIS — L02412 Cutaneous abscess of left axilla: Secondary | ICD-10-CM

## 2019-01-07 DIAGNOSIS — L03112 Cellulitis of left axilla: Secondary | ICD-10-CM

## 2019-01-07 DIAGNOSIS — Z882 Allergy status to sulfonamides status: Secondary | ICD-10-CM

## 2019-01-07 DIAGNOSIS — T7840XA Allergy, unspecified, initial encounter: Secondary | ICD-10-CM

## 2019-01-07 MED ORDER — DOXYCYCLINE HYCLATE 100 MG PO TABS: 100.0000 mg | ORAL_TABLET | Freq: Two times a day (BID) | ORAL | 0 refills | Status: DC

## 2019-01-07 NOTE — ED Triage Notes (Signed)
Per pt she was placed on antibiotic on Thursday from here and when she took it within an hour her tongue was swelling in the back and sides and she got nauseated. Pt has not taken since.

## 2019-01-07 NOTE — ED Provider Notes (Signed)
[facility]    CSN: [id number] Arrival date & time: 01/07/19  1151     History   Chief Complaint Chief Complaint  Patient presents with  . Allergic Reaction    HPI Colleen Garrett is a 33 y.o. female.   Patient was seen for an abscess 2 days prior to arrival.  She was treated with an antibiotic.Per pt she was placed on antibiotic on Thursday from here and when she took it within an hour her tongue was swelling in the back and sides and she got nauseated. Pt has not taken since.   Patient is also on stimulant medication.  Patient works from home     Past Medical History:  Diagnosis Date  . Chlamydia   . Hypertension   . Obesity   . UTI (lower urinary tract infection)    proteus UTI    Patient Active Problem List   Diagnosis Date Noted  . 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 No pertinent surgical history.  OB History   No obstetric history on file.      Home Medications    Prior to Admission medications   Medication Sig Start Date End Date Taking? Authorizing Provider  acetaminophen (TYLENOL) 325 MG tablet Take 650 mg by mouth every 6 (six) hours as needed (cramping).    [provider]  doxycycline (VIBRA-TABS) 100 MG tablet Take 1 tablet (100 mg total) by mouth 2 (two) times daily. 01/07/19   Elvina SidleLauenstein, Veniamin Kincaid, MD  hydrochlorothiazide (HYDRODIURIL) 25 MG tablet Take 1 tablet (25 mg total) by mouth daily. 04/24/14   Santiago GladLaisure, Heather, PA-C  sulfamethoxazole-trimethoprim (BACTRIM DS) 800-160 MG tablet Take 1 tablet by mouth 2 (two) times daily for 7 days. 01/04/19 01/11/19  Hall-Potvin, [place], PA-C    Family History Family History  Problem Relation Age of Onset  . Hypertension Mother   . Hypertension Father   . Hyperlipidemia Father     Social History Social History   Tobacco Use  . Smoking status: Never Smoker  . Smokeless tobacco: Never Used  Substance Use Topics  . Alcohol use: No  . Drug use: No      Allergies   Patient has no known allergies.   Review of Systems Review of Systems  Skin: Positive for rash and wound.  All other systems reviewed and are negative.    Physical Exam Triage Vital Signs ED Triage Vitals  Enc Vitals Group     BP --      Pulse Rate 01/07/19 1210 (!) 125     Resp 01/07/19 1210 16     Temp 01/07/19 1210 98.4 F (36.9 C)     Temp Source 01/07/19 1210 Oral     SpO2 01/07/19 1210 97 %     Weight --      Height --      Head Circumference --      Peak Flow --      Pain Score 01/07/19 1208 0     Pain Loc --      Pain Edu? --      Excl. in GC? --    No data found.  Updated Vital Signs Pulse (!) 125   Temp 98.4 F (36.9 C) (Oral)   Resp 16   SpO2 97%    Physical Exam Vitals signs and nursing note reviewed.  Constitutional:      Appearance: Normal appearance. She is obese.  Eyes:     Conjunctiva/sclera: Conjunctivae  normal.  Neck:     Musculoskeletal: Normal range of motion and neck supple.  Cardiovascular:     Rate and Rhythm: Tachycardia present.  Pulmonary:     Effort: Pulmonary effort is normal.  Musculoskeletal: Normal range of motion.  Skin:    General: Skin is warm.     Findings: Erythema and rash present.     Comments: Diffuse induration left axilla  Neurological:     General: No focal deficit present.     Mental Status: She is alert.  Psychiatric:        Mood and Affect: Mood normal.      UC Treatments / Results  Labs (all labs ordered are listed, but only abnormal results are displayed) Labs Reviewed - No data to display  EKG None  Radiology No results found.  Procedures Procedures (including critical care time)  Medications Ordered in UC Medications - No data to display  Initial Impression / Assessment and Plan / UC Course  I have reviewed the triage vital signs and the nursing notes.  Pertinent labs & imaging results that were available during my care of the patient were reviewed by me and considered  in my medical decision making (see chart for details).    Final Clinical Impressions(s) / UC Diagnoses   Final diagnoses:  Cellulitis of axilla, left  Allergic reaction, initial encounter  Allergy to sulfa drugs     Discharge Instructions     Return if the infection is not improving in 2 days  Continue the warm compresses.    ED Prescriptions    Medication Sig Dispense Auth. Provider   doxycycline (VIBRA-TABS) 100 MG tablet Take 1 tablet (100 mg total) by mouth 2 (two) times daily. 20 tablet Elvina Sidle, MD     Controlled Substance Prescriptions [place] Controlled Substance Registry consulted? Not Applicable   Elvina Sidle, MD 01/07/19 [phone]

## 2019-01-07 NOTE — Discharge Instructions (Addendum)
Return if the infection is not improving in 2 days  Continue the warm compresses.

## 2020-01-27 ENCOUNTER — Ambulatory Visit (HOSPITAL_COMMUNITY)
Admission: EM | Admit: 2020-01-27 | Discharge: 2020-01-27 | Disposition: A | Discharge status: Home / Self Care | Payer: No Typology Code available for payment source

## 2020-01-27 ENCOUNTER — Encounter (HOSPITAL_COMMUNITY): Payer: Self-pay

## 2020-01-27 ENCOUNTER — Other Ambulatory Visit: Payer: Self-pay

## 2020-01-27 DIAGNOSIS — I10 Essential (primary) hypertension: Secondary | ICD-10-CM

## 2020-01-27 DIAGNOSIS — L732 Hidradenitis suppurativa: Secondary | ICD-10-CM

## 2020-01-27 DIAGNOSIS — L0291 Cutaneous abscess, unspecified: Secondary | ICD-10-CM

## 2020-01-27 MED ORDER — DOXYCYCLINE HYCLATE 100 MG PO CAPS: 100.0000 mg | ORAL_CAPSULE | Freq: Two times a day (BID) | ORAL | 0 refills | Status: DC

## 2020-01-27 NOTE — Discharge Instructions (Signed)
You have a cyst/ forming abscess under your left arm. I will prescribe you doxycycline for infection and I recommend ibuprofen or Tyelnol for pain. These are non-addictive and may help with your pain. You recurrent cysts may be due to a disease called Hidradenitis, suppurativa. I recommended follow-up with Surgery as discussed in your previous visits.   You blood pressure is also extremely high. This is the same as your last visit. This can be very dangerous for you and cause long term damage to your heart. I recommend finding a Primary care provider as soon as possible to help treat this.

## 2020-01-27 NOTE — ED Provider Notes (Signed)
[facility]    CSN: [id number] Arrival date & time: 01/27/20  1004      History   Chief Complaint Chief Complaint  Patient presents with  . Abscess    HPI Colleen Garrett is a 34 y.o. female. She presents with a recurrent abscess of her left axilla. Reports symptoms of pain began about 1 weeks ago. Denies drainage, fever or redness. During her last visit a year ago she had wanted to follow-up with a general surgeon but has not. She has not tried anything for the abscess.  She also presents with asymptomatic hypertension. She has tried to follow-up with a PCP but has been uncomfortable with both providers she has seen. Requests another referral today    HPI  Past Medical History:  Diagnosis Date  . Chlamydia   . Hypertension   . Obesity   . UTI (lower urinary tract infection)    proteus UTI    Patient Active Problem List   Diagnosis Date Noted  . 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 No pertinent surgical history.  OB History   No obstetric history on file.      Home Medications    Prior to Admission medications   Medication Sig Start Date End Date Taking? Authorizing Provider  acetaminophen (TYLENOL) 325 MG tablet Take 650 mg by mouth every 6 (six) hours as needed (cramping).    [provider]  doxycycline (VIBRA-TABS) 100 MG tablet Take 1 tablet (100 mg total) by mouth 2 (two) times daily. 01/07/19   Elvina Sidle, MD  doxycycline (VIBRAMYCIN) 100 MG capsule Take 1 capsule (100 mg total) by mouth 2 (two) times daily. 01/27/20   Chilton Si, PA-C  hydrochlorothiazide (HYDRODIURIL) 25 MG tablet Take 1 tablet (25 mg total) by mouth daily. 04/24/14   Santiago Glad, PA-C  hydrocortisone cream 1 % Apply 1 application topically 2 (two) times daily. 10/12/19   [provider]    Family History Family History  Problem Relation Age of Onset  . Hypertension Mother   . Hypertension Father   . Hyperlipidemia  Father     Social History Social History   Tobacco Use  . Smoking status: Never Smoker  . Smokeless tobacco: Never Used  Substance Use Topics  . Alcohol use: No  . Drug use: No     Allergies   Patient has no known allergies.   Review of Systems Review of Systems  Constitutional: Negative for chills and fever.  Gastrointestinal: Positive for abdominal pain.  Musculoskeletal: Positive for back pain.  Skin: Negative for rash and wound.     Physical Exam Triage Vital Signs ED Triage Vitals  Enc Vitals Group     BP 01/27/20 1024 (!) 219/127     Pulse Rate 01/27/20 1024 91     Resp 01/27/20 1024 20     Temp 01/27/20 1024 98.3 F (36.8 C)     Temp Source 01/27/20 1024 Oral     SpO2 01/27/20 1024 97 %     Weight --      Height --      Head Circumference --      Peak Flow --      Pain Score 01/27/20 1021 5     Pain Loc --      Pain Edu? --      Excl. in GC? --    No data found.  Updated Vital Signs BP (!) 219/127 (BP Location: Right Wrist)  Pulse 91   Temp 98.3 F (36.8 C) (Oral)   Resp 20   LMP 01/26/2020 (Exact Date)   SpO2 97%   Visual Acuity Right Eye Distance:   Left Eye Distance:   Bilateral Distance:    Right Eye Near:   Left Eye Near:    Bilateral Near:     Physical Exam Constitutional:      General: She is not in acute distress.    Appearance: Normal appearance. She is obese.  HENT:     Head: Normocephalic.     Right Ear: External ear normal.     Left Ear: External ear normal.     Nose: Nose normal.  Eyes:     General:        Right eye: No discharge.        Left eye: No discharge.     Conjunctiva/sclera: Conjunctivae normal.  Cardiovascular:     Pulses: Normal pulses.  Pulmonary:     Effort: Pulmonary effort is normal. No respiratory distress.  Abdominal:     General: Abdomen is flat. There is no distension.  Musculoskeletal:        General: Normal range of motion.  Skin:    Comments: Left lateral axilla has a small area of  induration without fluctuance cyst is only about 2X1 cm  Neurological:     Mental Status: She is alert.  Psychiatric:        Mood and Affect: Mood is depressed.        Initial Impression / Assessment and Plan / UC Course  I have reviewed the triage vital signs and the nursing notes.  Pertinent labs & imaging results that were available during my care of the patient were reviewed by me and considered in my medical decision making (see chart for details).     Ms. Kamaka has a recurrent abscess and HTN, He abscess is not fluctuant and I&D would be unproductive. Recommended warm compresses, ibuprofen and prescription antibiotics. Advised return to clinic if abscess begins to drain or feel fluctuant like a water balloon. I also strongly advised follow-up with a PCP and provided recommendations for her . She voices understanding and agrees with the plan  Final Clinical Impressions(s) / UC Diagnoses   Final diagnoses:  Abscess  Hypertension, unspecified type  Hidradenitis suppurativa     Discharge Instructions     You have a cyst/ forming abscess under your left arm. I will prescribe you doxycycline for infection and I recommend ibuprofen or Tyelnol for pain. These are non-addictive and may help with your pain. You recurrent cysts may be due to a disease called Hidradenitis, suppurativa. I recommended follow-up with Surgery as discussed in your previous visits.   You blood pressure is also extremely high. This is the same as your last visit. This can be very dangerous for you and cause long term damage to your heart. I recommend finding a Primary care provider as soon as possible to help treat this.      ED Prescriptions    Medication Sig Dispense Auth. Provider   doxycycline (VIBRAMYCIN) 100 MG capsule Take 1 capsule (100 mg total) by mouth 2 (two) times daily. 20 capsule Domingo Dimes, PA-C     PDMP not reviewed this encounter.   Domingo Dimes, PA-C 01/27/20  1157

## 2020-01-27 NOTE — ED Notes (Signed)
BP reported to PA Prichard. Pt denies any headache, nausea, abdominal pain, dizziness, chest pain, diarrhea.

## 2020-01-27 NOTE — ED Triage Notes (Signed)
Pt states having and abscess in the left axilla x 1 week.

## 2020-02-03 ENCOUNTER — Telehealth: Payer: Self-pay | Admitting: Physician Assistant

## 2020-02-03 NOTE — Telephone Encounter (Signed)
Copied from CRM [id number]. Topic: Appointment Scheduling - Scheduling Inquiry for Clinic >> Feb 02, 2020  5:10 PM Randol Kern wrote: Pt would like to be scheduled for a new patient appt with Joycelyn Man, pt works M-F until 5:40 pm. Needs a provider during the times that Antony Contras is available, not referred by an existing patient but desperately needs to establish with a provider available after 5. Please advise  Best contact: [phone]

## 2020-02-13 NOTE — Telephone Encounter (Signed)
I am not sure if I can accept any new patients at this time with [place] being understaffed.   If she is willing to wait until Sept she can be put on the schedule then.  As for the initial visit though, she will have to be seen before 5pm as my last new patient is at 4pm. All after hours are for established and acute visits only.

## 2020-02-13 NOTE — Telephone Encounter (Signed)
LMTCB 02/13/2020.  PEC please advise pt as below.    Thanks,   -Vernona Rieger

## 2020-07-22 ENCOUNTER — Other Ambulatory Visit: Payer: Self-pay

## 2020-07-22 ENCOUNTER — Emergency Department (HOSPITAL_COMMUNITY): Payer: No Typology Code available for payment source

## 2020-07-22 ENCOUNTER — Emergency Department (HOSPITAL_COMMUNITY)
Admission: EM | Admit: 2020-07-22 | Discharge: 2020-07-22 | Disposition: A | Discharge status: Home / Self Care | Payer: No Typology Code available for payment source | Attending: Emergency Medicine | Admitting: Emergency Medicine

## 2020-07-22 DIAGNOSIS — K8050 Calculus of bile duct without cholangitis or cholecystitis without obstruction: Secondary | ICD-10-CM | POA: Insufficient documentation

## 2020-07-22 DIAGNOSIS — Z79899 Other long term (current) drug therapy: Secondary | ICD-10-CM | POA: Insufficient documentation

## 2020-07-22 DIAGNOSIS — I10 Essential (primary) hypertension: Secondary | ICD-10-CM | POA: Insufficient documentation

## 2020-07-22 DIAGNOSIS — R109 Unspecified abdominal pain: Secondary | ICD-10-CM | POA: Diagnosis present

## 2020-07-22 DIAGNOSIS — K8020 Calculus of gallbladder without cholecystitis without obstruction: Secondary | ICD-10-CM

## 2020-07-22 DIAGNOSIS — K824 Cholesterolosis of gallbladder: Secondary | ICD-10-CM

## 2020-07-22 LAB — COMPREHENSIVE METABOLIC PANEL
ALT: 16 U/L (ref 0–44)
AST: 21 U/L (ref 15–41)
Albumin: 3.5 g/dL (ref 3.5–5.0)
Alkaline Phosphatase: 54 U/L (ref 38–126)
Anion gap: 13 (ref 5–15)
BUN: 14 mg/dL (ref 6–20)
CO2: 25 mmol/L (ref 22–32)
Calcium: 9.5 mg/dL (ref 8.9–10.3)
Chloride: 98 mmol/L (ref 98–111)
Creatinine, Ser: 1.6 mg/dL — ABNORMAL HIGH (ref 0.44–1.00)
GFR, Estimated: 44 mL/min — ABNORMAL LOW (ref >60)
Glucose, Bld: 96 mg/dL (ref 70–99)
Potassium: 3.4 mmol/L — ABNORMAL LOW (ref 3.5–5.1)
Sodium: 136 mmol/L (ref 135–145)
Total Bilirubin: 0.9 mg/dL (ref 0.3–1.2)
Total Protein: 9.1 g/dL — ABNORMAL HIGH (ref 6.5–8.1)

## 2020-07-22 LAB — URINALYSIS, ROUTINE W REFLEX MICROSCOPIC
Glucose, UA: NEGATIVE mg/dL
Hgb urine dipstick: NEGATIVE
Ketones, ur: 20 mg/dL — AB
Nitrite: NEGATIVE
Protein, ur: >=300 mg/dL — AB
Specific Gravity, Urine: 1.031 — ABNORMAL HIGH (ref 1.005–1.030)
Squamous Epithelial / HPF: >50 — ABNORMAL HIGH (ref 0–5)
pH: 5 (ref 5.0–8.0)

## 2020-07-22 LAB — I-STAT CHEM 8, ED
BUN: 16 mg/dL (ref 6–20)
Calcium, Ion: 1.08 mmol/L — ABNORMAL LOW (ref 1.15–1.40)
Chloride: 100 mmol/L (ref 98–111)
Creatinine, Ser: 1.1 mg/dL — ABNORMAL HIGH (ref 0.44–1.00)
Glucose, Bld: 99 mg/dL (ref 70–99)
HCT: 42 % (ref 36.0–46.0)
Hemoglobin: 14.3 g/dL (ref 12.0–15.0)
Potassium: 3.4 mmol/L — ABNORMAL LOW (ref 3.5–5.1)
Sodium: 136 mmol/L (ref 135–145)
TCO2: 24 mmol/L (ref 22–32)

## 2020-07-22 LAB — CBC
HCT: 41.2 % (ref 36.0–46.0)
Hemoglobin: 11.7 g/dL — ABNORMAL LOW (ref 12.0–15.0)
MCH: 21.3 pg — ABNORMAL LOW (ref 26.0–34.0)
MCHC: 28.4 g/dL — ABNORMAL LOW (ref 30.0–36.0)
MCV: 74.9 fL — ABNORMAL LOW (ref 80.0–100.0)
Platelets: 376 10*3/uL (ref 150–400)
RBC: 5.5 MIL/uL — ABNORMAL HIGH (ref 3.87–5.11)
RDW: 20.7 % — ABNORMAL HIGH (ref 11.5–15.5)
WBC: 6.5 10*3/uL (ref 4.0–10.5)
nRBC: 0 % (ref 0.0–0.2)

## 2020-07-22 LAB — I-STAT BETA HCG BLOOD, ED (MC, WL, AP ONLY): I-stat hCG, quantitative: <5 m[IU]/mL (ref <5)

## 2020-07-22 LAB — LIPASE, BLOOD: Lipase: 20 U/L (ref 11–51)

## 2020-07-22 MED ORDER — ONDANSETRON 8 MG PO TBDP: 8.0000 mg | ORAL_TABLET | Freq: Three times a day (TID) | ORAL | 0 refills | Status: DC | PRN

## 2020-07-22 MED ORDER — AMLODIPINE BESYLATE 5 MG PO TABS
5.0000 mg | ORAL_TABLET | Freq: Once | ORAL | Status: AC
  Administered 2020-07-22: 20:00:00 5 mg via ORAL
  Filled 2020-07-22: qty 1

## 2020-07-22 MED ORDER — HYDROMORPHONE HCL 1 MG/ML IJ SOLN
1.0000 mg | Freq: Once | INTRAMUSCULAR | Status: AC
  Administered 2020-07-22: 16:00:00 1 mg via INTRAVENOUS
  Filled 2020-07-22: qty 1

## 2020-07-22 MED ORDER — SODIUM CHLORIDE 0.9 % IV BOLUS (SEPSIS)
1000.0000 mL | Freq: Once | INTRAVENOUS | Status: AC
  Administered 2020-07-22: 16:00:00 1000 mL via INTRAVENOUS

## 2020-07-22 MED ORDER — ONDANSETRON HCL 4 MG/2ML IJ SOLN
4.0000 mg | Freq: Once | INTRAMUSCULAR | Status: AC
  Administered 2020-07-22: 16:00:00 4 mg via INTRAVENOUS
  Filled 2020-07-22: qty 2

## 2020-07-22 MED ORDER — HYDROCODONE-ACETAMINOPHEN 5-325 MG PO TABS: 1.0000 | ORAL_TABLET | Freq: Four times a day (QID) | ORAL | 0 refills | Status: DC | PRN

## 2020-07-22 MED ORDER — SODIUM CHLORIDE 0.9 % IV SOLN
1000.0000 mL | INTRAVENOUS | Status: DC
  Administered 2020-07-22: 18:00:00 1000 mL via INTRAVENOUS

## 2020-07-22 NOTE — ED Triage Notes (Signed)
Pt here from home for eval of abdominal pain with n/v since Saturday night. Denies diarrhea. Last BM Friday.

## 2020-07-22 NOTE — ED Notes (Signed)
Pt able to stand with steady gait able to ambulate without assistance.

## 2020-07-22 NOTE — ED Provider Notes (Signed)
[facility] EMERGENCY DEPARTMENT Provider Note   CSN: [id number] Arrival date & time: 07/22/20  1007     History Chief Complaint  Patient presents with  . Emesis  . Abdominal Pain    Colleen Garrett is a 34 y.o. female.  HPI   Patient presents to the ED for evaluation of abdominal pain and vomiting. Patient states the symptoms started Saturday night. She started with vomiting and had upper abdominal discomfort. She ended up calling EMS but the symptoms improved by the time they arrived so she decided not to go to the hospital. Patient states she was doing okay on Sunday and eventually the vomiting resolved but she still did not feel back to normal. This morning she tried eating some oatmeal and started having pain and vomiting again. The pain is primarily in her upper abdomen. Goes towards the right side. She denies any diarrhea. No dysuria. No fevers but she has felt diaphoretic. She has not had a bowel movement since Friday. Prior abdominal surgeries  Past Medical History:  Diagnosis Date  . Chlamydia   . Hypertension   . Obesity   . UTI (lower urinary tract infection)    proteus UTI    Patient Active Problem List   Diagnosis Date Noted  . Allergy to sulfa drugs 01/07/2019    No past surgical history on file.   OB History   No obstetric history on file.     Family History  Problem Relation Age of Onset  . Hypertension Mother   . Hypertension Father   . Hyperlipidemia Father     Social History   Tobacco Use  . Smoking status: Never Smoker  . Smokeless tobacco: Never Used  Substance Use Topics  . Alcohol use: No  . Drug use: No    Home Medications Prior to Admission medications   Medication Sig Start Date End Date Taking? Authorizing Provider  acetaminophen (TYLENOL) 325 MG tablet Take 650 mg by mouth every 6 (six) hours as needed (cramping).    [provider]  doxycycline (VIBRA-TABS) 100 MG tablet Take 1 tablet (100 mg  total) by mouth 2 (two) times daily. 01/07/19   Elvina Sidle, MD  doxycycline (VIBRAMYCIN) 100 MG capsule Take 1 capsule (100 mg total) by mouth 2 (two) times daily. 01/27/20   Chilton Si, PA-C  hydrochlorothiazide (HYDRODIURIL) 25 MG tablet Take 1 tablet (25 mg total) by mouth daily. 04/24/14   Santiago Glad, PA-C  HYDROcodone-acetaminophen (NORCO/VICODIN) 5-325 MG tablet Take 1 tablet by mouth every 6 (six) hours as needed. 07/22/20   Linwood Dibbles, MD  hydrocortisone cream 1 % Apply 1 application topically 2 (two) times daily. 10/12/19   [provider]  ondansetron (ZOFRAN ODT) 8 MG disintegrating tablet Take 1 tablet (8 mg total) by mouth every 8 (eight) hours as needed for nausea or vomiting. 07/22/20   Linwood Dibbles, MD    Allergies    Patient has no known allergies.  Review of Systems   Review of Systems  All other systems reviewed and are negative.   Physical Exam Updated Vital Signs BP (!) 155/101   Pulse 89   Temp 97.8 F (36.6 C)   Resp 19   SpO2 99%   Physical Exam Vitals and nursing note reviewed.  Constitutional:      Appearance: She is well-nourished. She is obese. She is ill-appearing.  HENT:     Head: Normocephalic and atraumatic.     Right Ear: External ear  normal.     Left Ear: External ear normal.  Eyes:     General: No scleral icterus.       Right eye: No discharge.        Left eye: No discharge.     Conjunctiva/sclera: Conjunctivae normal.  Neck:     Trachea: No tracheal deviation.  Cardiovascular:     Rate and Rhythm: Normal rate and regular rhythm.     Pulses: Intact distal pulses.  Pulmonary:     Effort: Pulmonary effort is normal. No respiratory distress.     Breath sounds: Normal breath sounds. No stridor. No wheezing or rales.  Abdominal:     General: Bowel sounds are normal. There is no distension.     Palpations: Abdomen is soft.     Tenderness: There is abdominal tenderness in the right upper quadrant and epigastric area.  There is guarding. There is no rebound.     Hernia: No hernia is present.  Musculoskeletal:        General: No tenderness or edema.     Cervical back: Neck supple.  Skin:    General: Skin is warm and dry.     Findings: No rash.  Neurological:     Mental Status: She is alert.     Cranial Nerves: No cranial nerve deficit (no facial droop, extraocular movements intact, no slurred speech).     Sensory: No sensory deficit.     Motor: No abnormal muscle tone or seizure activity.     Coordination: Coordination normal.     Deep Tendon Reflexes: Strength normal.  Psychiatric:        Mood and Affect: Mood and affect normal.     ED Results / Procedures / Treatments   Labs (all labs ordered are listed, but only abnormal results are displayed) Labs Reviewed  COMPREHENSIVE METABOLIC PANEL - Abnormal; Notable for the following components:      Result Value   Potassium 3.4 (*)    Creatinine, Ser 1.60 (*)    Total Protein 9.1 (*)    GFR, Estimated 44 (*)    All other components within normal limits  CBC - Abnormal; Notable for the following components:   RBC 5.50 (*)    Hemoglobin 11.7 (*)    MCV 74.9 (*)    MCH 21.3 (*)    MCHC 28.4 (*)    RDW 20.7 (*)    All other components within normal limits  URINALYSIS, ROUTINE W REFLEX MICROSCOPIC - Abnormal; Notable for the following components:   Color, Urine AMBER (*)    APPearance TURBID (*)    Specific Gravity, Urine 1.031 (*)    Bilirubin Urine MODERATE (*)    Ketones, ur 20 (*)    Protein, ur >=300 (*)    Leukocytes,Ua TRACE (*)    Bacteria, UA MANY (*)    Squamous Epithelial / LPF >50 (*)    All other components within normal limits  I-STAT CHEM 8, ED - Abnormal; Notable for the following components:   Potassium 3.4 (*)    Creatinine, Ser 1.10 (*)    Calcium, Ion 1.08 (*)    All other components within normal limits  LIPASE, BLOOD  I-STAT BETA HCG BLOOD, ED ([facility], [facility], [facility] ONLY)    EKG None  Radiology US Abdomen  Complete  Result Date: 07/22/2020 CLINICAL DATA:  Upper abdominal pain and vomiting. EXAM: ABDOMEN ULTRASOUND COMPLETE COMPARISON:  None. FINDINGS: Gallbladder: Physiologically distended. Shadowing intraluminal gallstones, including a stone in the  gallbladder neck. No gallbladder wall thickening or pericholecystic fluid. Rounded echogenic structure adherent to the gallbladder wall measuring 11 mm has no internal vascularity and likely represents a polyp. No sonographic Murphy sign noted by sonographer. Common bile duct: Diameter: 2-3 mm, normal. Liver: Diffusely increased in parenchymal echogenicity. No focal lesion is seen. No definite capsular nodularity. Portal vein is patent on color Doppler imaging with normal direction of blood flow towards the liver. IVC: No abnormality visualized. Pancreas: Not well visualized due to overlying bowel gas and habitus. Spleen: Size and appearance within normal limits. Right Kidney: Length: 11.7 cm. Echogenicity within normal limits. No mass or hydronephrosis visualized. Left Kidney: Length: 10.6 cm. Echogenicity within normal limits. No mass or hydronephrosis visualized. Abdominal aorta: No aneurysm visualized. Other findings: No abdominal ascites. IMPRESSION: 1. Gallstones, including a stone in the gallbladder neck. No sonographic findings of acute cholecystitis. 2. No biliary dilatation. 3. Gallbladder polyp measuring 11 mm. Recommend surgical consult given size greater than 10 mm. 4. Increased hepatic echogenicity typical of steatosis. 5. Otherwise unremarkable abdominal ultrasound. Electronically Signed   By: Narda Rutherford M.D.   On: 07/22/2020 18:40    Procedures Procedures (including critical care time)  Medications Ordered in ED Medications  sodium chloride 0.9 % bolus 1,000 mL (0 mLs Intravenous Stopped 07/22/20 1712)    Followed by  0.9 %  sodium chloride infusion (1,000 mLs Intravenous New Bag/Given 07/22/20 1822)  amLODipine (NORVASC) tablet 5 mg  (has no administration in time range)  HYDROmorphone (DILAUDID) injection 1 mg (1 mg Intravenous Given 07/22/20 1531)  ondansetron (ZOFRAN) injection 4 mg (4 mg Intravenous Given 07/22/20 1530)    ED Course  I have reviewed the triage vital signs and the nursing notes.  Pertinent labs & imaging results that were available during my care of the patient were reviewed by me and considered in my medical decision making (see chart for details).  Clinical Course as of 07/22/20 1919  Mon Jul 22, 2020  1518 Labs reviewed. No leukocytosis. LFTs and lipase are normal. Urinalysis does show 2150 white blood cells but there is also many bacteria and greater than 50 squamous cells. Appears contaminated.  DDx: pancreatitis, hepatitis, cholecystitis, gastritis, no hx of surgery, obstruction less likely [JK]  1918 Repeat exam no tenderness palpation [JK]    Clinical Course User Index [JK] Linwood Dibbles, MD   MDM Rules/Calculators/A&P                          Patient presented to the ED for evaluation of nausea vomiting and abdominal pain.  Patient's pain was primarily in the upper abdomen in the epigastric region.  Symptoms were concerning for the possibility of pancreatitis, cholecystitis, hepatitis, gastritis.  Obstruction less likely.  Urinalysis did show white blood cells and bacteria but there is a large amount of squamous cells.  I suspect this was contamination.  CBC was normal.  Metabolic panel and lipase unremarkable.  Ultrasound was performed.  Patient does have evidence of gallstones and a gallbladder polyp but no evidence of cholecystitis.  Patient symptoms have resolved.  She is no longer having any pain.  Suspect she had an episode of biliary colic and doubt acute cholecystitis.  Plan on discharge home with close outpatient follow-up with general surgery warning signs and precautions discussed.  Patient was also notably hypertensive.  Patient states she does not usually have hypertension.  She does  see her primary care doctor and does not  take any medications for blood pressure.  Is possible her hypertension could have been related to the pain and stress.  I will give her a dose of Norvasc and recommend she follow-up with her primary care doctor to have her blood pressure rechecked Final Clinical Impression(s) / ED Diagnoses Final diagnoses:  Biliary colic  Gallstones  Gallbladder polyp  Hypertension, unspecified type    Rx / DC Orders ED Discharge Orders         Ordered    HYDROcodone-acetaminophen (NORCO/VICODIN) 5-325 MG tablet  Every 6 hours PRN        07/22/20 1916    ondansetron (ZOFRAN ODT) 8 MG disintegrating tablet  Every 8 hours PRN        07/22/20 1916           Linwood Dibbles, MD 07/22/20 1919

## 2020-07-22 NOTE — Discharge Instructions (Addendum)
Follow-up with your primary care doctor to check on your blood pressure.  Schedule an appointment with Dr. Bedelia Person, general surgery.  Try to eat a low-fat diet.  Return to the ED for recurrent symptoms, pain, fever

## 2020-08-22 ENCOUNTER — Ambulatory Visit: Payer: Self-pay | Admitting: Surgery

## 2022-05-11 IMAGING — US US ABDOMEN COMPLETE
1 series · 13 of 25 positions shown · non-contrast
Comparison: None.

CLINICAL DATA: Upper abdominal pain and vomiting.

EXAM:
ABDOMEN ULTRASOUND COMPLETE

[Series 1: us abdomen complete · 13 of 108 slices shown]
[im 1/108]
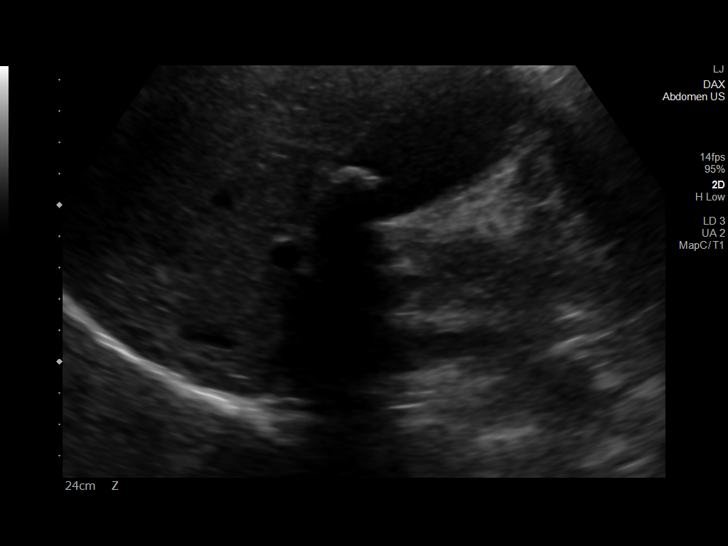
[im 9/108]
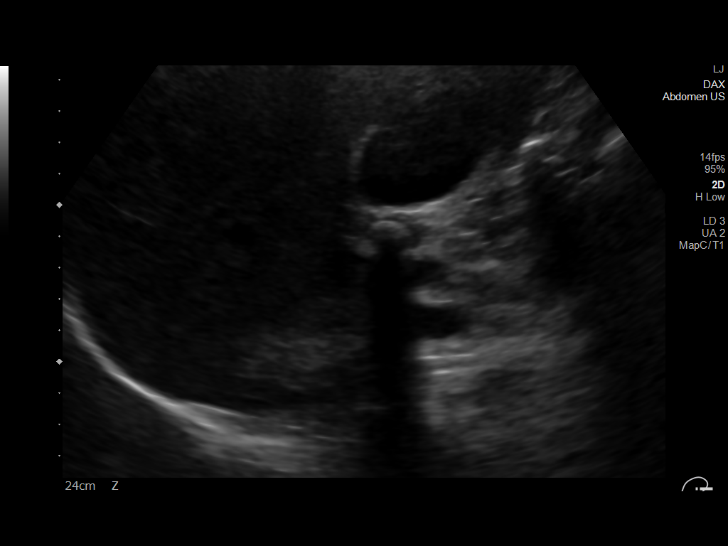
[im 18/108]
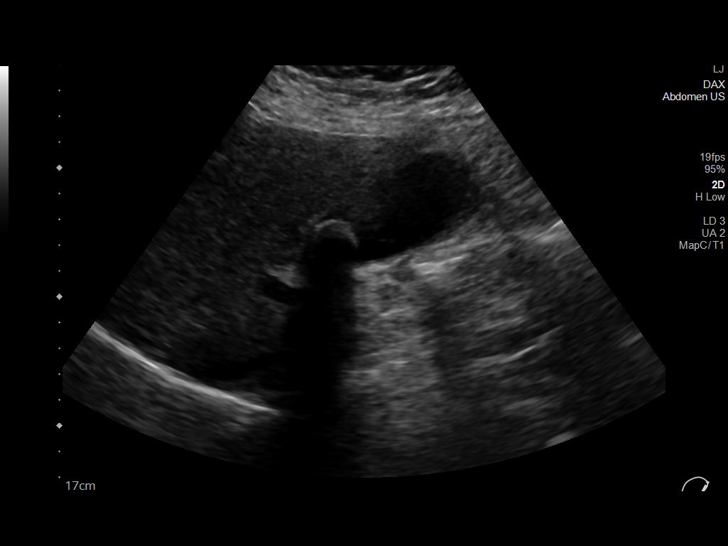
[im 27/108]
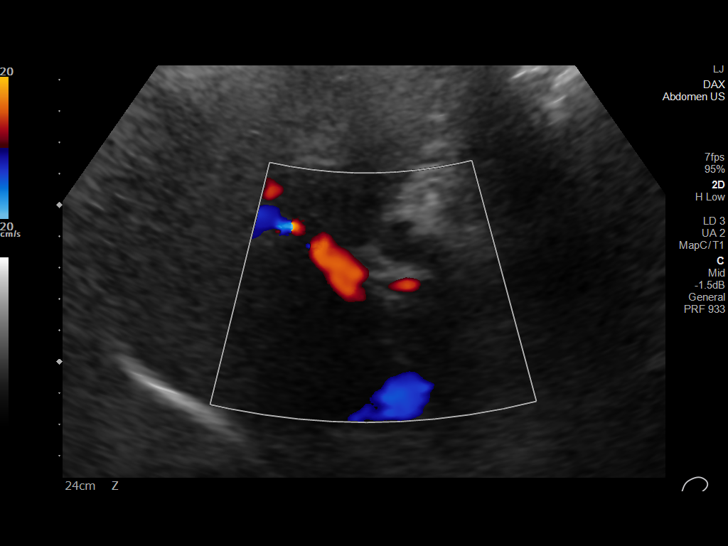
[im 36/108]
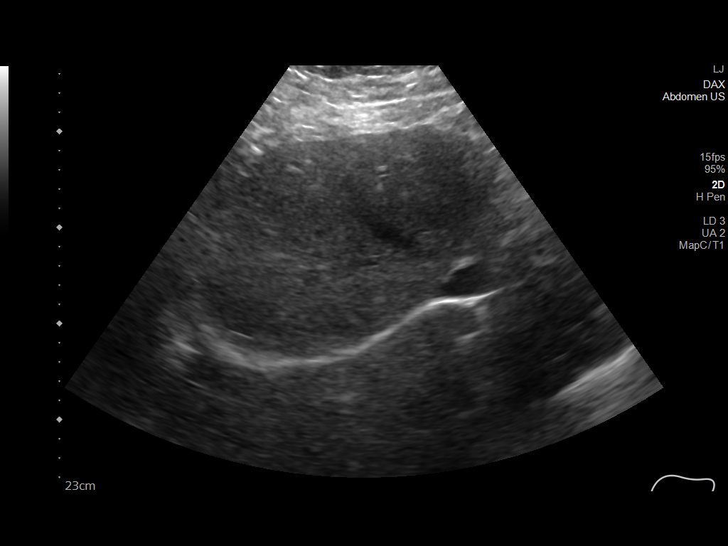
[im 45/108]
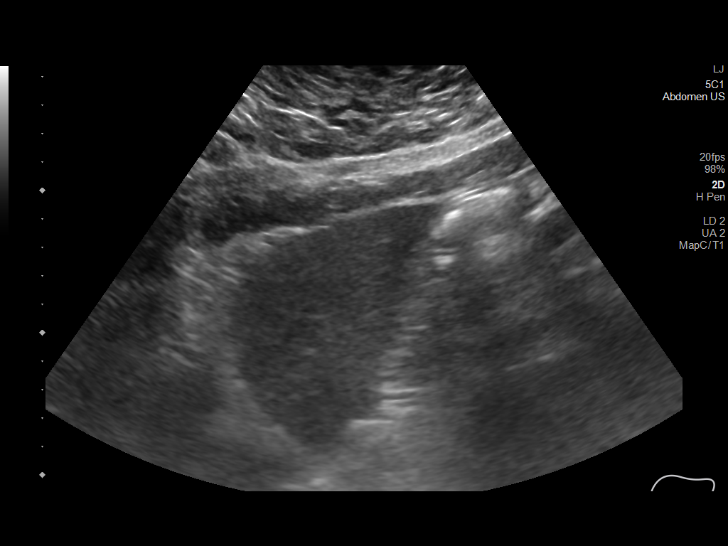
[im 54/108]
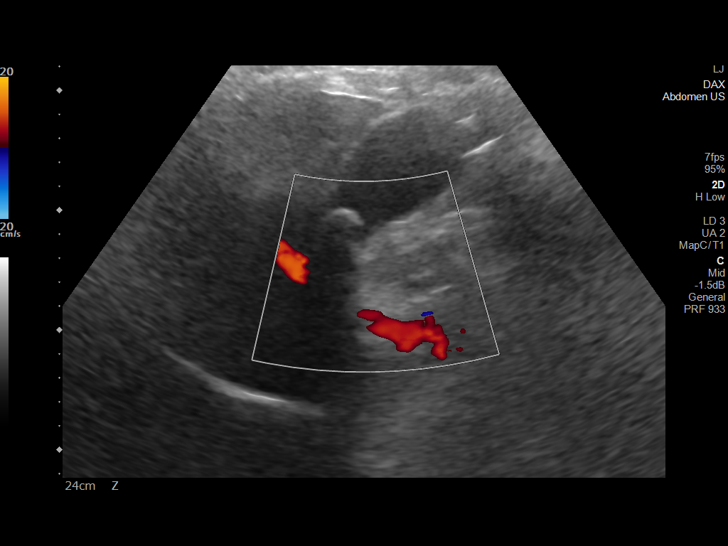
[im 63/108]
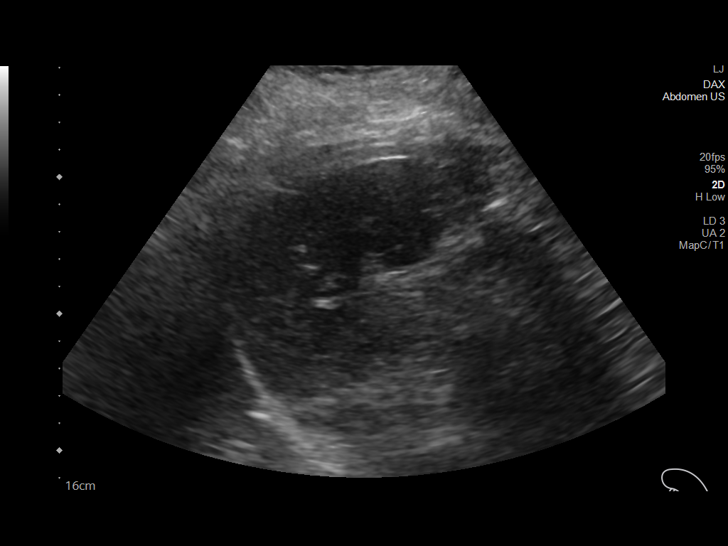
[im 72/108]
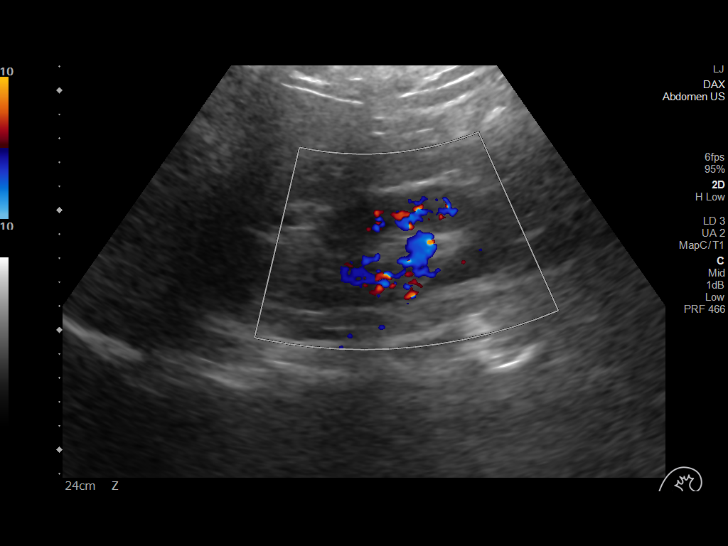
[im 81/108]
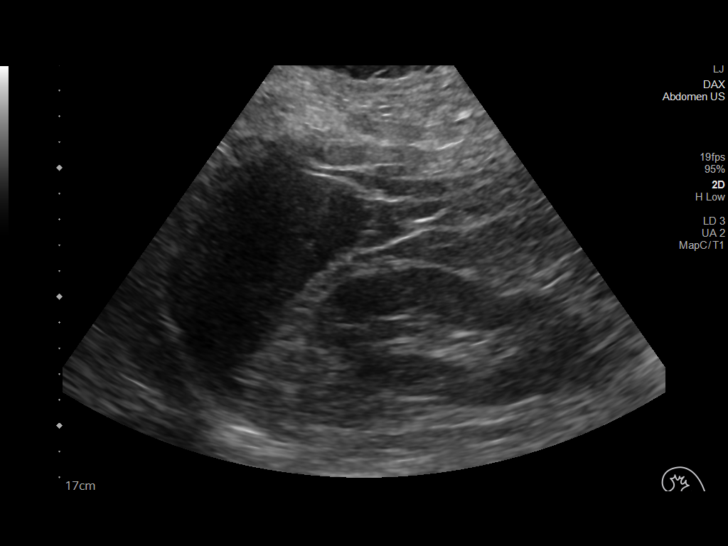
[im 90/108]
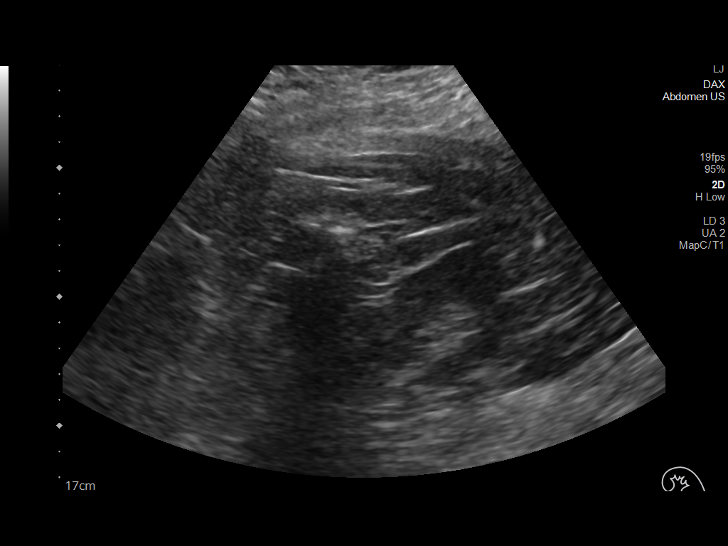
[im 99/108]
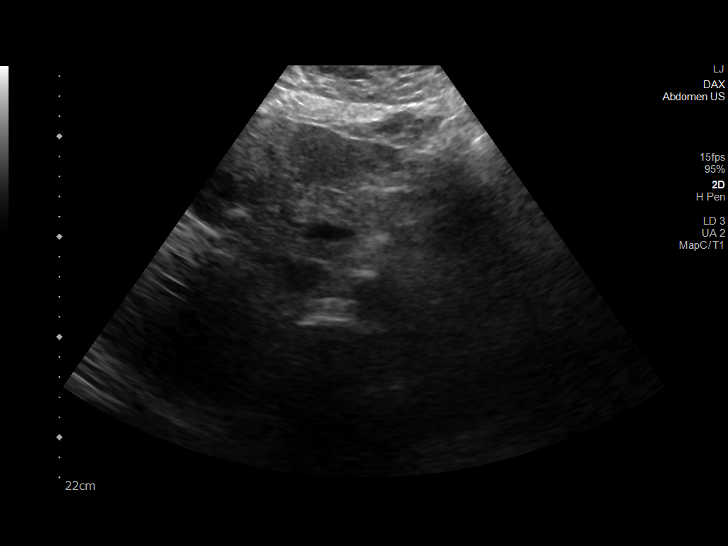
[im 108/108]
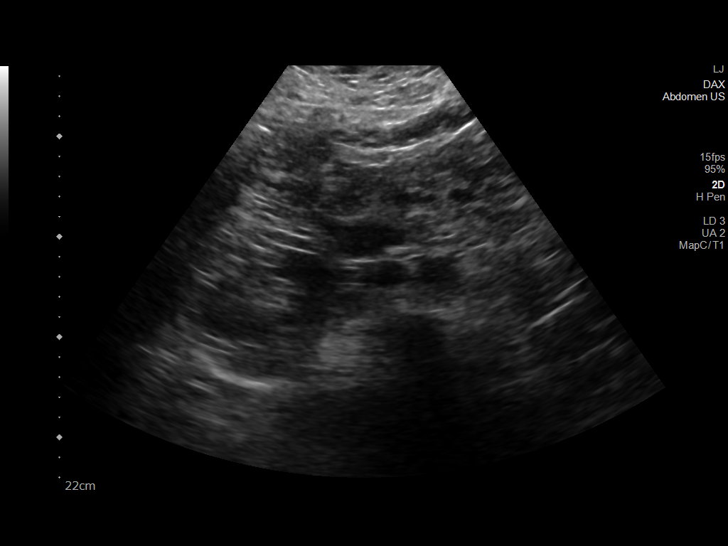

[13 of 25 positions shown; findings below may reference images not displayed]

FINDINGS: Gallbladder: Physiologically distended. Shadowing intraluminal
gallstones, including a stone in the gallbladder neck. No
gallbladder wall thickening or pericholecystic fluid. Rounded
echogenic structure adherent to the gallbladder wall measuring 11 mm
has no internal vascularity and likely represents a polyp. No
sonographic Murphy sign noted by sonographer.

Common bile duct: Diameter: 2-3 mm, normal.

Liver: Diffusely increased in parenchymal echogenicity. No focal
lesion is seen. No definite capsular nodularity. Portal vein is
patent on color Doppler imaging with normal direction of blood flow
towards the liver.

IVC: No abnormality visualized.

Pancreas: Not well visualized due to overlying bowel gas and
habitus.

Spleen: Size and appearance within normal limits.

Right Kidney: Length: 11.7 cm. Echogenicity within normal limits. No
mass or hydronephrosis visualized.

Left Kidney: Length: 10.6 cm. Echogenicity within normal limits. No
mass or hydronephrosis visualized.

Abdominal aorta: No aneurysm visualized.

Other findings: No abdominal ascites.
IMPRESSION: 1. Gallstones, including a stone in the gallbladder neck. No
sonographic findings of acute cholecystitis.
2. No biliary dilatation.
3. Gallbladder polyp measuring 11 mm. Recommend surgical consult
given size greater than 10 mm.
4. Increased hepatic echogenicity typical of steatosis.
5. Otherwise unremarkable abdominal ultrasound.

## 2022-07-23 ENCOUNTER — Telehealth: Payer: Self-pay

## 2022-07-23 ENCOUNTER — Encounter (HOSPITAL_BASED_OUTPATIENT_CLINIC_OR_DEPARTMENT_OTHER): Payer: Self-pay | Admitting: Family

## 2022-07-23 ENCOUNTER — Ambulatory Visit (HOSPITAL_BASED_OUTPATIENT_CLINIC_OR_DEPARTMENT_OTHER): Payer: No Typology Code available for payment source | Admitting: Family

## 2022-07-23 VITALS — BP 189/144 | HR 82 | Ht 66.25 in | Wt 355.0 lb

## 2022-07-23 DIAGNOSIS — Z6841 Body Mass Index (BMI) 40.0 and over, adult: Secondary | ICD-10-CM

## 2022-07-23 DIAGNOSIS — Z8249 Family history of ischemic heart disease and other diseases of the circulatory system: Secondary | ICD-10-CM

## 2022-07-23 DIAGNOSIS — Z0000 Encounter for general adult medical examination without abnormal findings: Secondary | ICD-10-CM

## 2022-07-23 DIAGNOSIS — I10 Essential (primary) hypertension: Secondary | ICD-10-CM

## 2022-07-23 DIAGNOSIS — Z006 Encounter for examination for normal comparison and control in clinical research program: Secondary | ICD-10-CM

## 2022-07-23 MED ORDER — AMLODIPINE-OLMESARTAN 5-40 MG PO TABS: 1.0000 | ORAL_TABLET | Freq: Every day | ORAL | 5 refills | Status: DC

## 2022-07-23 NOTE — Progress Notes (Signed)
Advanced Hypertension Clinic Initial Assessment:    Date:  07/23/2022   ID:  Colleen Garrett, DOB October 20, 1985, MRN [id number]  PCP:  Deatra Canter, NP  Cardiologist:  None  Nephrologist:  Referring MD: No ref. provider found   CC: Hypertension  History of Present Illness:    Colleen Garrett is a 36 y.o. female with a hx of anxiety, hypertension, cholelithiasis here to establish care in the Advanced Hypertension Clinic.   She was seen by [facility] surgery with plan for surgery due to symptomatic cholelithiasis however noted to have severe hypertension in clinic without associated symptoms.  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 Colleen Garrett was diagnosed with hypertension and took hydrochlorothiazide many years ago but was bothered by urinary frequency.  Tells me her blood pressure was previously better controlled when she was on Saxenda for weight loss prescribed at primary care that she had GI side effects and had to discontinue.  She has not seen her primary care provider in a year and a half.  Reports no prior tobacco use though does use marijuana.  No formal exercise routine. She eats at home and outside of the home and does not follow low sodium diet.   Reports no shortness of breath nor dyspnea on exertion. Reports no chest pain, pressure, or tightness. No edema, orthopnea, PND. Reports no palpitations.  Notes some morning nausea which she associates with her galbladder. Does not snore that she is aware of. Wakes feeling well rested.   She notes a strong family history of cardiac disease.  She is excited to take better control of her health.  Her sister recently had a stroke and is presently hospitalized.  Her brother has had multiple heart surgeries and heart disease.  Her mother passed away of an MI.  Previous antihypertensives: HCTZ - urinary frequency  Past Medical History:  Diagnosis Date   Chlamydia     Hypertension    Obesity    UTI (lower urinary tract infection)    proteus UTI    History reviewed. No pertinent surgical history.  Current Medications: Current Meds  Medication Sig   amLODipine-olmesartan (AZOR) 5-40 MG tablet Take 1 tablet by mouth daily.   BUPROPION HCL PO Take by mouth daily.   clobetasol (TEMOVATE) 0.05 % external solution Apply topically daily as needed.   escitalopram (LEXAPRO) 10 MG tablet Take by mouth daily.     Allergies:   Doxycycline   Social History   Socioeconomic History   Marital status: Single    Spouse name: Not on file   Number of children: Not on file   Years of education: Not on file   Highest education level: Not on file  Occupational History   Not on file  Tobacco Use   Smoking status: Never   Smokeless tobacco: Never  Substance and Sexual Activity   Alcohol use: No   Drug use: Yes    Types: Marijuana    Comment: daily   Sexual activity: Yes    Birth control/protection: None    Comment: last contact 4.17.2013  Other Topics Concern   Not on file  Social History Narrative   Not on file   Social Determinants of Health   Financial Resource Strain: Not on file  Food Insecurity: No Food Insecurity (07/23/2022)   Hunger Vital Sign    Worried About Running Out of Food in the Last Year: Never  true    Ran Out of Food in the Last Year: Never true  Transportation Needs: No Transportation Needs (07/23/2022)   PRAPARE - Administrator, Civil Service (Medical): No    Lack of Transportation (Non-Medical): No  Physical Activity: Insufficiently Active (07/23/2022)   Exercise Vital Sign    Days of Exercise per Week: 7 days    Minutes of Exercise per Session: 20 min  Stress: Not on file  Social Connections: Not on file     Family History: The patient's family history includes Coronary artery disease in her brother; Heart attack in her father and mother; Hyperlipidemia in her brother and father; Hypertension in her  father and mother; Obesity in her sister; Skin cancer in her brother; Stroke in her sister.  ROS:   Please see the history of present illness.     All other systems reviewed and are negative.  EKGs/Labs/Other Studies Reviewed:    EKG:  EKG is  ordered today.  The ekg ordered today demonstrates NSR 82 bpm with no acute ST/treated changes.  Recent Labs: No results found for requested labs within last 365 days.   Recent Lipid Panel No results found for: "CHOL", "TRIG", "HDL", "CHOLHDL", "VLDL", "LDLCALC", "LDLDIRECT"  Physical Exam:   VS:  BP (!) 189/144 Comment: right wrist  Pulse 82   Ht 5' 6.25" (1.683 m)   Wt (!) 355 lb (161 kg)   BMI 56.87 kg/m  , BMI Body mass index is 56.87 kg/m. GENERAL:  Well appearing, overweight HEENT: Pupils equal round and reactive, fundi not visualized, oral mucosa unremarkable NECK:  No jugular venous distention, waveform within normal limits, carotid upstroke brisk and symmetric, no bruits, no thyromegaly LYMPHATICS:  No cervical adenopathy LUNGS:  Clear to auscultation bilaterally HEART:  RRR.  PMI not displaced or sustained,S1 and S2 within normal limits, no S3, no S4, no clicks, no rubs, no murmurs ABD:  Flat, positive bowel sounds normal in frequency in pitch, no bruits, no rebound, no guarding, no midline pulsatile mass, no hepatomegaly, no splenomegaly EXT:  2 plus pulses throughout, no edema, no cyanosis no clubbing SKIN:  No rashes no nodules NEURO:  Cranial nerves II through XII grossly intact, motor grossly intact throughout PSYCH:  Cognitively intact, oriented to person place and time   ASSESSMENT/PLAN:    HTN - BP not at goal <130/80.  Start amlodipine-olmesartan 5-40 mg daily.  She is hesitant regarding diuretics as she reports previous urinary frequency when taking hydrochlorothiazide.  She does note difficulty swallowing pills and prefers a combination tablet.  Labs today for secondary workup including CMP, cortisol, catecholamines,  metanephrines, TSH.  Renal artery duplex to rule out renal artery stenosis.  No snoring or daytime somnolence suggestive of sleep apnea however could consider in future if BP remains difficult to control due to body habitus. Referred to PREP exercise program at [facility].  She was enrolled in remote patient monitoring study today and provided home blood pressure cuff.  Family history of cardiovascular disease - Lipid panel, CMP, lipoprotein a today. No anginal symptoms and EKG with no acute ST/T wave changes. Could consider coronary calcium score at follow up for screening for coronary artery disease. Heart healthy diet and regular cardiovascular exercise encouraged.    Morbid obesity - Weight loss via diet and exercise encouraged. Discussed the impact being overweight would have on cardiovascular risk. Previously on Saxenda >1 year ago but did not tolerate due to GI side effects. Encouraged to re-establish with  PCP. Could consider GLP1 for weight loss.  Anxiety - Continue to follow with PCP.   Cholelithiasis - Pending surgery with [facility] and was referred for hypertension. Management as above.  Screening for Secondary Hypertension:     07/23/2022   10:15 AM  Causes  Drugs/Herbals Screened     - Comments marijuana  Renovascular HTN Screened  Thyroid Disease Screened  Hyperaldosteronism Screened  Pheochromocytoma Screened  Cushing's Syndrome Screened    Relevant Labs/Studies:    Latest Ref Rng & Units 07/22/2020   10:37 AM 07/22/2020   10:30 AM 09/22/2014   10:23 PM  Basic Labs  Sodium 135 - 145 mmol/L 136  136  140   Potassium 3.5 - 5.1 mmol/L 3.4  3.4  3.3   Creatinine 0.44 - 1.00 mg/dL 2.42  6.83  4.19                    07/23/2022    9:24 AM  Renovascular   Renal Artery [place] Completed Yes      she consents to be monitored in our remote patient monitoring program through [employer].  she will track his blood pressure twice daily and understands that these trends will help  [place] to adjust her medications as needed prior to his next appointment.  she  interested in enrolling in the PREP exercise and nutrition program through the [facility].     Disposition:    FU with APP in 2 months and with MD in 4 months   Medication Adjustments/Labs and Tests Ordered: Current medicines are reviewed at length with the patient today.  Concerns regarding medicines are outlined above.  Orders Placed This Encounter  Procedures   Comprehensive metabolic panel   Lipid panel   TSH   Lipoprotein A (LPA)   Cortisol   Catecholamines, fractionated, plasma   Metanephrines, plasma   Amb Referral To Provider Referral Exercise Program (P.R.E.P)   Referral to HRT/VAS Care Navigation   Cantril's Ladder Assessment   EKG 12-Lead   VAS US RENAL ARTERY DUPLEX   Meds ordered this encounter  Medications   amLODipine-olmesartan (AZOR) 5-40 MG tablet    Sig: Take 1 tablet by mouth daily.    Dispense:  30 tablet    Refill:  5    Order Specific Question:   Supervising Provider    Answer:   Jodelle Red [[id number]]     Signed, Alver Sorrow, NP  07/23/2022 10:16 AM    [facility]

## 2022-07-23 NOTE — Research (Signed)
  Subject Name: Colleen Garrett  Colleen Garrett met inclusion and exclusion criteria for the Virtual Care and Social Determinant Interventions for the management of hypertension trial.  The informed consent form, study requirements and expectations were reviewed with the subject by Dr. Oval Linsey and myself. The subject was given the opportunity to read the consent and ask questions. The subject verbalized understanding of the trial requirements.  All questions were addressed prior to the signing of the consent form. The subject agreed to participate in the trial and signed the informed consent. The informed consent was obtained prior to performance of any protocol-specific procedures for the subject.  A copy of the signed informed consent was given to the subject and a copy was placed in the subject's medical record.  Colleen Garrett was randomized to Group 2.

## 2022-07-23 NOTE — Telephone Encounter (Signed)
Patient returned call. [employer] welcome call conducted. Patient did not have any questions regarding the [employer] app and cuff. Patient is interested in health coaching for improving healthy eating habits. Patient has been scheduled for her initial session in-person on 12/20 at 9am.    Myah Guynes Nedra Hai, [facility] Guide, Health Coach [address], [address] [place] [address] Telephone: [phone] Email: [email or web address]

## 2022-07-23 NOTE — Telephone Encounter (Signed)
Called patient to conduct [employer] welcome call and offer health coaching per referral from Gillian Shields, NP. Patient did not answer. Left message for patient to return call.   Kashawn Manzano Nedra Hai [facility] Guide, Health Coach [address] [place] [address] Telephone: [phone] Email: [email or web address]

## 2022-07-23 NOTE — Patient Instructions (Addendum)
Medication Instructions:  Your physician has recommended you make the following change in your medication:  Start: Amlodipine- Olmesartan 5-40mg  daily    Labwork: Your physician recommends that you return for lab work today- cortisol, catecholamines, metanephrines, CMP, lipid panel, Lpa, TSH   Testing/Procedures: Your physician has requested that you have a renal artery duplex. During this test, an ultrasound is used to evaluate blood flow to the kidneys. Allow one hour for this exam. Do not eat after midnight the day before and avoid carbonated beverages. Take your medications as you usually do.    Follow-Up: FEBRUARY 15TH AT 8AM WITH CAITLIN WALKER    APRIL 10TH AT 8AM WITH DR. Bassett  Referrals:  We have referred you to Amy for health coaching, she will reach out to get you scheduled.  We have referred you to PREP, they will reach out to get you scheduled.    Special Instructions:  DASH Eating Plan DASH stands for Dietary Approaches to Stop Hypertension. The DASH eating plan is a healthy eating plan that has been shown to: Reduce high blood pressure (hypertension). Reduce your risk for type 2 diabetes, heart disease, and stroke. Help with weight loss. What are tips for following this plan? Reading food labels Check food labels for the amount of salt (sodium) per serving. Choose foods with less than 5 percent of the Daily Value of sodium. Generally, foods with less than 300 milligrams (mg) of sodium per serving fit into this eating plan. To find whole grains, look for the word "whole" as the first word in the ingredient list. Shopping Buy products labeled as "low-sodium" or "no salt added." Buy fresh foods. Avoid canned foods and pre-made or frozen meals. Cooking Avoid adding salt when cooking. Use salt-free seasonings or herbs instead of table salt or sea salt. Check with your health care provider or pharmacist before using salt substitutes. Do not fry foods. Cook foods  using healthy methods such as baking, boiling, grilling, roasting, and broiling instead. Cook with heart-healthy oils, such as olive, canola, avocado, soybean, or sunflower oil. Meal planning  Eat a balanced diet that includes: 4 or more servings of fruits and 4 or more servings of vegetables each day. Try to fill one-half of your plate with fruits and vegetables. 6-8 servings of whole grains each day. Less than 6 oz (170 g) of lean meat, poultry, or fish each day. A 3-oz (85-g) serving of meat is about the same size as a deck of cards. One egg equals 1 oz (28 g). 2-3 servings of low-fat dairy each day. One serving is 1 cup (237 mL). 1 serving of nuts, seeds, or beans 5 times each week. 2-3 servings of heart-healthy fats. Healthy fats called omega-3 fatty acids are found in foods such as walnuts, flaxseeds, fortified milks, and eggs. These fats are also found in cold-water fish, such as sardines, salmon, and mackerel. Limit how much you eat of: Canned or prepackaged foods. Food that is high in trans fat, such as some fried foods. Food that is high in saturated fat, such as fatty meat. Desserts and other sweets, sugary drinks, and other foods with added sugar. Full-fat dairy products. Do not salt foods before eating. Do not eat more than 4 egg yolks a week. Try to eat at least 2 vegetarian meals a week. Eat more home-cooked food and less restaurant, buffet, and fast food. Lifestyle When eating at a restaurant, ask that your food be prepared with less salt or no salt, if possible.  If you drink alcohol: Limit how much you use to: 0-1 drink a day for women who are not pregnant. 0-2 drinks a day for men. Be aware of how much alcohol is in your drink. In the [place] one drink equals one 12 oz bottle of beer (355 mL), one 5 oz glass of wine (148 mL), or one 1 oz glass of hard liquor (44 mL). General information Avoid eating more than 2,300 mg of salt a day. If you have hypertension, you may need  to reduce your sodium intake to 1,500 mg a day. Work with your health care provider to maintain a healthy body weight or to lose weight. Ask what an ideal weight is for you. Get at least 30 minutes of exercise that causes your heart to beat faster (aerobic exercise) most days of the week. Activities may include walking, swimming, or biking. Work with your health care provider or dietitian to adjust your eating plan to your individual calorie needs. What foods should I eat? Fruits All fresh, dried, or frozen fruit. Canned fruit in natural juice (without added sugar). Vegetables Fresh or frozen vegetables (raw, steamed, roasted, or grilled). Low-sodium or reduced-sodium tomato and vegetable juice. Low-sodium or reduced-sodium tomato sauce and tomato paste. Low-sodium or reduced-sodium canned vegetables. Grains Whole-grain or whole-wheat bread. Whole-grain or whole-wheat pasta. Brown rice. [place]. Bulgur. Whole-grain and low-sodium cereals. Pita bread. Low-fat, low-sodium crackers. Whole-wheat flour tortillas. Meats and other proteins Skinless chicken or [place]. Ground chicken or [place]. Pork with fat trimmed off. Fish and seafood. Egg whites. Dried beans, peas, or lentils. Unsalted nuts, nut butters, and seeds. Unsalted canned beans. Lean cuts of beef with fat trimmed off. Low-sodium, lean precooked or cured meat, such as sausages or meat loaves. Dairy Low-fat (1%) or fat-free (skim) milk. Reduced-fat, low-fat, or fat-free cheeses. Nonfat, low-sodium ricotta or cottage cheese. Low-fat or nonfat yogurt. Low-fat, low-sodium cheese. Fats and oils Soft margarine without trans fats. Vegetable oil. Reduced-fat, low-fat, or light mayonnaise and salad dressings (reduced-sodium). Canola, safflower, olive, avocado, soybean, and sunflower oils. Avocado. Seasonings and condiments Herbs. Spices. Seasoning mixes without salt. Other foods Unsalted popcorn and pretzels. Fat-free sweets. The items listed  above may not be a complete list of foods and beverages you can eat. Contact a dietitian for more information. What foods should I avoid? Fruits Canned fruit in a light or heavy syrup. Fried fruit. Fruit in cream or butter sauce. Vegetables Creamed or fried vegetables. Vegetables in a cheese sauce. Regular canned vegetables (not low-sodium or reduced-sodium). Regular canned tomato sauce and paste (not low-sodium or reduced-sodium). Regular tomato and vegetable juice (not low-sodium or reduced-sodium). Angie Fava. Olives. Grains Baked goods made with fat, such as croissants, muffins, or some breads. Dry pasta or rice meal packs. Meats and other proteins Fatty cuts of meat. Ribs. Fried meat. Berniece Salines. Bologna, salami, and other precooked or cured meats, such as sausages or meat loaves. Fat from the back of a pig (fatback). Bratwurst. Salted nuts and seeds. Canned beans with added salt. Canned or smoked fish. Whole eggs or egg yolks. Chicken or [place] with skin. Dairy Whole or 2% milk, cream, and half-and-half. Whole or full-fat cream cheese. Whole-fat or sweetened yogurt. Full-fat cheese. Nondairy creamers. Whipped toppings. Processed cheese and cheese spreads. Fats and oils Butter. Stick margarine. Lard. Shortening. Ghee. Bacon fat. Tropical oils, such as coconut, palm kernel, or palm oil. Seasonings and condiments Onion salt, garlic salt, seasoned salt, table salt, and sea salt. Worcestershire sauce. Tartar sauce. Barbecue sauce.  Teriyaki sauce. Soy sauce, including reduced-sodium. Steak sauce. Canned and packaged gravies. Fish sauce. Oyster sauce. Cocktail sauce. Store-bought horseradish. Ketchup. Mustard. Meat flavorings and tenderizers. Bouillon cubes. Hot sauces. Pre-made or packaged marinades. Pre-made or packaged taco seasonings. Relishes. Regular salad dressings. Other foods Salted popcorn and pretzels. The items listed above may not be a complete list of foods and beverages you should avoid.  Contact a dietitian for more information. Where to find more information National Heart, Lung, and Blood Institute: https://T[employer] [place]-eaton.com/ American Heart Association: www.heart.org Academy of Nutrition and Dietetics: www.eatright.[place]: www.kidney.org Summary The DASH eating plan is a healthy eating plan that has been shown to reduce high blood pressure (hypertension). It may also reduce your risk for type 2 diabetes, heart disease, and stroke. When on the DASH eating plan, aim to eat more fresh fruits and vegetables, whole grains, lean proteins, low-fat dairy, and heart-healthy fats. With the DASH eating plan, you should limit salt (sodium) intake to 2,300 mg a day. If you have hypertension, you may need to reduce your sodium intake to 1,500 mg a day. Work with your health care provider or dietitian to adjust your eating plan to your individual calorie needs. This information is not intended to replace advice given to you by your health care provider. Make sure you discuss any questions you have with your health care provider. Document Revised: 06/30/2019 Document Reviewed: 06/30/2019 Elsevier Patient Education  [employer].

## 2022-07-24 ENCOUNTER — Telehealth: Payer: Self-pay

## 2022-07-24 NOTE — Telephone Encounter (Signed)
Called to discuss PREP program referral; left voicemail. 

## 2022-07-29 ENCOUNTER — Ambulatory Visit (INDEPENDENT_AMBULATORY_CARE_PROVIDER_SITE_OTHER): Payer: No Typology Code available for payment source

## 2022-07-29 DIAGNOSIS — Z0000 Encounter for general adult medical examination without abnormal findings: Secondary | ICD-10-CM

## 2022-07-29 NOTE — Progress Notes (Signed)
Appointment Outcome: Completed, Session #: Initial                        Start time: 9:06am   End time: 10:15am  Total Mins: 69 minutes  AGREEMENTS SECTION   Overall Goal(s): Improve healthy eating habits                                              Agreement/Action Steps:  Reduce Sodium Intake Continue to use Mrs. Dash/Salt free Seasonings Read food labels Practice portion control Determine new eating schedule Meal plan/prep for 2-3 days at a time  Progress Notes:  Patient is participating in the [facility] blood pressure monitoring system and wants to improve her eating behaviors to help improve her blood pressure. Patient stated that she wants to make these changes to decrease her risks for other health complications based on her family hx. Patient shared that she has been stressed but is working to prioritize herself and health.  Patient was also referred to [facility] for exercise by Gillian Shields, NP. Patient shared that due to time constraints and responsibilities she has been working on increasing her steps by taking a longer walking route to various destinations. Patient shared that she is interested in taking walks in the future with her sister. Patient previously walked a dog twice a week for multiple increments of 30 minutes.   Patient stated that she has begun to make changes to her eating habits since her last cardiology visit. Patient mentioned that she replaced all of her seasonings with Mrs. Dash and other salt free seasonings. Patient shared that she enjoys spicy food but realized that the spicy seasoning she used including hot sauce was high in sodium, so she was able to find a salt free spice instead. Patient reported that she has been reading food labels and recognize the sodium content, but would like to learn more about how to read food labels.   Discussed with patient additional strategies to reduce sodium intake such as purchasing low-sodium or no sodium added canned goods,  comparing food labels of frozen vegetables to canned vegetables, and choosing fresh produce when available.   Patient shared that she has made some changes to the foods she would snack on. Patient reported that she has purchased apples, salt free pretzels, peanut butter, and apple sauce. Patient stated that she has started reducing how much she eats at one time. Patient wants to practice eating until she is satisfied and not over eat.  Patient stated that she has a challenge of eating on a better schedule. Patient shared that she typically eats breakfast around 2pm and dinner at 2am. Patient has mentioned that she has a snack around 7pm while working. Patient currently works from 1pm to 10pm. Patient shared that when she worked an earlier shift, she would try to eat before 9am and was able to eat by 7:30pm and would like to start eating earlier so that she doesn't go to bed after eating.   Discussed with the patient the idea of meal planning and prepping to help her have food available ahead of meal times to aid in changing her eating schedule. Patient shared that she talked with a family member about this idea yesterday and is interested in trying it. Patient reported that she has been researching healthy recipes that are quick to  prepare that she will be able to enjoy and also prepare for her sister.   Patient was asked if she had someone she could depend on for support and accountability while making these health behavior changes. Patient mentioned that her best friend has embarked on her own health journey and has been helping hold her accountable.   Indicators of Success and Accountability: Patient attended her initial health coaching session to plan her action steps towards improving her eating habits to aid in reducing her blood pressure. Readiness: Patient is in the action stage of improving her eating habits. Strengths and Supports: Patient stated that her best friend will be her accountability  partner. Patient is relying on being determined and optimistic about making healthy behavior changes. Challenges and Barriers: Patient challenges/barriers to implementing her action steps is her work schedule over the next two weeks.    Coaching Outcomes: Patient stated that she will try to work on figuring out a new eating schedule that works for her. Patient will meal plan and prep meals to have available to help with changing her eating schedule.  Patient is interested in learning how to properly read food labels.  Patient will begin to work towards being consistent implementing the action steps outlined above that she identified that she started to incorporate into her lifestyle along with the additional identified steps she believes will be helpful in improving her eating habits and blood pressure.  Mailed patient information on how to read food labels, meal planning sheets, how to practice portion control.  Reviewed Coaching Agreement and Code of Ethics with Patient during initial session. Provided patient with a hard/electronic copy of the Coaching Agreement and Code of Ethics. Answered any questions the patient had, if any, regarding the Coaching Agreement and Code of Ethics. Patient verbally agreed to adhere to the Coaching Agreement and to abide by the Code of Ethics.

## 2022-08-04 LAB — COMPREHENSIVE METABOLIC PANEL
ALT: 14 IU/L (ref 0–32)
AST: 14 IU/L (ref 0–40)
Albumin/Globulin Ratio: 1 — ABNORMAL LOW (ref 1.2–2.2)
Albumin: 3.7 g/dL — ABNORMAL LOW (ref 3.9–4.9)
Alkaline Phosphatase: 63 IU/L (ref 44–121)
BUN/Creatinine Ratio: 8 — ABNORMAL LOW (ref 9–23)
BUN: 7 mg/dL (ref 6–20)
Bilirubin Total: 0.2 mg/dL (ref 0.0–1.2)
CO2: 24 mmol/L (ref 20–29)
Calcium: 8.8 mg/dL (ref 8.7–10.2)
Chloride: 101 mmol/L (ref 96–106)
Creatinine, Ser: 0.92 mg/dL (ref 0.57–1.00)
Globulin, Total: 3.7 g/dL (ref 1.5–4.5)
Glucose: 89 mg/dL (ref 70–99)
Potassium: 3.7 mmol/L (ref 3.5–5.2)
Sodium: 136 mmol/L (ref 134–144)
Total Protein: 7.4 g/dL (ref 6.0–8.5)
eGFR: 84 mL/min/{1.73_m2} (ref >59)

## 2022-08-04 LAB — LIPID PANEL
Chol/HDL Ratio: 3.4 ratio (ref 0.0–4.4)
Cholesterol, Total: 115 mg/dL (ref 100–199)
HDL: 34 mg/dL — ABNORMAL LOW (ref >39)
LDL Chol Calc (NIH): 66 mg/dL (ref 0–99)
Triglycerides: 73 mg/dL (ref 0–149)
VLDL Cholesterol Cal: 15 mg/dL (ref 5–40)

## 2022-08-04 LAB — METANEPHRINES, PLASMA
Metanephrine, Free: <25 pg/mL (ref 0.0–88.0)
Normetanephrine, Free: 63.3 pg/mL (ref 0.0–210.1)

## 2022-08-04 LAB — LIPOPROTEIN A (LPA): Lipoprotein (a): 113.5 nmol/L — ABNORMAL HIGH (ref <75.0)

## 2022-08-04 LAB — CATECHOLAMINES, FRACTIONATED, PLASMA
Dopamine: <30 pg/mL (ref 0–48)
Epinephrine: <15 pg/mL (ref 0–62)
Norepinephrine: 325 pg/mL (ref 0–874)

## 2022-08-04 LAB — CORTISOL: Cortisol: 7.9 ug/dL (ref 6.2–19.4)

## 2022-08-04 LAB — TSH: TSH: 0.693 u[IU]/mL (ref 0.450–4.500)

## 2022-08-12 ENCOUNTER — Ambulatory Visit: Payer: No Typology Code available for payment source

## 2022-08-12 ENCOUNTER — Ambulatory Visit: Payer: No Typology Code available for payment source | Attending: Cardiovascular Disease

## 2022-08-12 ENCOUNTER — Telehealth: Payer: Self-pay

## 2022-08-12 DIAGNOSIS — Z0000 Encounter for general adult medical examination without abnormal findings: Secondary | ICD-10-CM

## 2022-08-12 NOTE — Telephone Encounter (Signed)
Called patient to determine if she was on her way to appointment. Patient overslept and requested to be rescheduled. Patient has been scheduled for a telephonic health coaching session today at 11:00am. Patient will be called at that time.   Avelino Leeds, MS, ERHD, [facility]  Care Guide, Health & Wellness Coach [address] [place] [address] Telephone: [phone] Email: [email or web address]

## 2022-08-12 NOTE — Progress Notes (Signed)
Appointment Outcome: Completed, Session #: 1                        Start time: 11:00am   End time: 11:55am   Total Mins: 55 minutes  AGREEMENTS SECTION   Overall Goal(s): Improve healthy eating habits                                               Agreement/Action Steps:  Reduce Sodium Intake Continue to use Mrs. Dash/Salt free Seasonings Read food labels Practice portion control Determine new eating schedule Meal plan/prep for 2-3 days at a time  Progress Notes:  Patient rated her progress over the past two weeks as moderate. Patient shared that she has been engaging in meal planning and prepping her meals for three days at a time. Patient mentioned that she is still having issues with knowing how to properly read a food label based on serving size. Patient has been monitoring her sodium intake by continuing to use Mrs. Dash or salt free seasonings. Patient questions how to determine how much sodium is contained in a meal that she cooks if her seasoning contains sodium.   Patient mentioned that she has begun working on eating breakfast earlier than 2pm, although it makes her nauseous. Patient stated that she wants to be consistent with eating breakfast after she wake up in the morning. Patient stated that she has practiced portion control by using small bowls instead of plates. Patient shared that she noticed when eating from a plate she has more surface to fill and tend to eat more compared to using the bowls.   Patient explained that while eating a small bowl of a meal prepared (e.g., chicken, rice, and vegetables), she will drink approximately 2 bottles of water to help her fill fuller, so she doesn't eat more. Patient shared that after waiting 30 minutes later, she will have a snack at work. Patient mentioned that sometimes the snack consists of two cups of apple sauce. Patient shared that at other times, she may have chicken salad with crackers or hummus.   Patient mentioned that she is  about to embark on a spiritual fast with her church that restricts carbs, sugars, sodas, and juice from the diet for one month. Patient shared that engaging in this practice may be challenging by she has been able to do it before. Patient stated that she is interested in eating vegetarian protein bowls that she learned about from the [place] app of replacing two meals with meat with vegetarian meals.   Patient mentioned that she has a treadmill in her home that she uses and can track how far she has walked in a week. Patient shared that seeing her progress helps motivated her to continue walking. Patient is interested in using physical activity to help her get out of the house.  Patient reported that she is feeling stressed and anxious. Patient stated that she has reached out to her provider to schedule an appointment because she feels that it is important to take care of her mental health as part of her self-care. Patient stated that when she feels overwhelmed, she implements positive self-talk to focus on the positive aspects of her current situation. Patient expressed interest in working on increasing her motivation, prioritizing herself, and improving self-care.    Indicators of Success and  Accountability: Patient was able to engage in all her action steps over the past two weeks.  Readiness: Patient is in the action stage of improving her eating habits.  Strengths and Supports: Patient is relying on her curiosity and planning skills to engage in her action steps towards improving her eating habits.  Challenges and Barriers: Patient is feeling overwhelmed, which may be a challenge/barrier to her engagement with her action steps.    Coaching Outcomes: Discussed with patient how to read food labels and serving sizes. 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   Emailed patient a copy of a sodium tracker sheet and information on  stress management.   Discussed with patient how stress plays a role in her eating habits. Patient recognizes that she eat when bored or stressed, and may not choose the healthiest option to eat. Discussed with patient various activities that she could engage in when she is bored while working and has free time to keep from snacking that will help her achieving her goal.  Patient is interested in improving her self-care by incorporating physical activity in the mornings. Patient stated that a safe place she can walk in her neighborhood takes up to 10 minutes. Patient mentioned that it is a low impact activity that she can incorporate to get her out of the house.   Patient revised action steps that she will implement over the next two weeks are outlined below.   Overall Goal(s): Improve healthy eating habits   Stress management  Agreement/Action Steps:  Reduce Sodium Intake Continue to use Mrs. Dash/Salt free Seasonings Read food labels Practice portion control  Determine new eating schedule Meal plan/prep for 2-3 days at a time Eat breakfast in the morning  Self-care Walk in the morning for approximately 10 minutes   Attempted: Fulfilled - Patient completed the biweekly agreement in full and was able to meet the challenge.

## 2022-08-13 ENCOUNTER — Telehealth (HOSPITAL_BASED_OUTPATIENT_CLINIC_OR_DEPARTMENT_OTHER): Payer: Self-pay

## 2022-08-13 MED ORDER — AMLODIPINE-OLMESARTAN 10-40 MG PO TABS: 1.0000 | ORAL_TABLET | Freq: Every day | ORAL | 6 refills | Status: DC

## 2022-08-13 NOTE — Telephone Encounter (Signed)
Patient returned call to the office. She states that she has really been working on reducing her salt and stress intake. She is happy to start the increased dose to see if it makes a difference. Rx to pharmacy.

## 2022-08-13 NOTE — Telephone Encounter (Addendum)
Left message for patient to call back     ----- Message from Loel Dubonnet, NP sent at 08/12/2022  5:06 PM EST ----- Regarding: RE: [facility] patient Thanks for letting me know!  Daphene Jaeger - can we please call and ask her to change her Amlodipine-Olmesartan to 10-40mg  daily? TY!  Best,  Loel Dubonnet, NP  ----- Message ----- From: Jarrett Soho, RN Sent: 08/12/2022   1:05 PM EST To: Rockne Menghini, RPH-CPP; # Subject: [facility] patient                                 Hello all,   I want to bring one of the [facility] patients to your attention. Ms Disla has a 2 week average of 164/109. Today's BPs alone have been 172-181/105-118. Her last med change was 12/14. Every time we contact her, she says that she feels ok.   Thanks,  Erasmo Downer

## 2022-08-13 NOTE — Addendum Note (Signed)
Addended by: Gerald Stabs on: 08/13/2022 09:25 AM   Modules accepted: Orders

## 2022-08-20 ENCOUNTER — Ambulatory Visit (HOSPITAL_BASED_OUTPATIENT_CLINIC_OR_DEPARTMENT_OTHER): Payer: No Typology Code available for payment source

## 2022-08-20 DIAGNOSIS — I10 Essential (primary) hypertension: Secondary | ICD-10-CM | POA: Diagnosis not present

## 2022-08-27 ENCOUNTER — Ambulatory Visit: Payer: No Typology Code available for payment source

## 2022-08-27 ENCOUNTER — Telehealth: Payer: Self-pay | Admitting: *Deleted

## 2022-08-27 DIAGNOSIS — I10 Essential (primary) hypertension: Secondary | ICD-10-CM

## 2022-08-27 DIAGNOSIS — Z0000 Encounter for general adult medical examination without abnormal findings: Secondary | ICD-10-CM

## 2022-08-27 NOTE — Progress Notes (Signed)
Appointment Outcome: Completed, Session #: 2                        Start time: 11:10am   End time: 11:45am   Total Mins: 35 minutes  AGREEMENTS SECTION   Overall Goal(s): Improve healthy eating habits   Stress management   Agreement/Action Steps:  Reduce Sodium Intake Continue to use Mrs. 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 Patient stated that she doesn't see outside of those parts of her life. Patient feels that she is not balancing her schedule and responsibilities well, along with having time to focus on herself.   Patient reported that she is engaged in practicing portion control 90% of the time. Patient stated that she tends to eat more at night after work. Patient is prepping meals for 2-3 days after work around [place]. Patient is finding herself cooking more frequently because she is sharing meals with her sister.   Patient stated that she has tried eating breakfast around 9am, but she continues to be nauseous. Patient stated that she has tried eating something as small as grits but had an adverse reaction. Patient stated that she could only tolerate sugar free flavored water to take her medications. Patient is finding herself eating a late breakfast between 12pm-2pm.  Patient has not gone grocery shopping to read food labels of additional foods to consume. Patient stated that she is aware that the snacks that she has been consuming is higher in sugar. Patient has been monitoring her sodium intake by not cooking with salt or adding salt after cooking. Patient stated that the food does not taste as good  this way so using a salt free seasoning has helped. Patient is also practicing portion control 90% of the time by using a small bowl. Patient stated to aid in controlling her hunger, she drinks water before and after eating.   Patient shared that she has visited her PCP and talked about her challenges. Patient has resumed medications to manage anxiety and stated that it is beginning to help. Patient shared her concerns with eating at night and wanting to resume taking a previous weight management drug. Patient mentioned that her PCP advised changing her eating schedule to having her last meal before 10pm and going to bed earlier.   Patient shared that with how things are currently, she doesn't see how this will be possible and that after 10pm is her down time.   Indicators of Success and Accountability:  Patient was not able to identify any indicators of her success and accountability and stated that her blood pressure has not improved or lose weight.  Readiness: Patient is in the action stage of improving eating habits and managing stress. Strengths and Supports: Patient currently do not have identified supports. Patient is relying on her desire to improve her health.  Challenges and Barriers: Patient has additional responsibilities as a caregiver and not being motivated due to not being able to focus as much on working towards goals.    Coaching Outcomes: Discussed with patient the mindset of not looking at the  outcomes, but recognizing the steps she is able to implement in the process and the progress she is making towards her goals.  Patient stated that when she goes grocery shopping soon, she is going to focus on purchasing healthier snacks.   Discussed with patient examples of physical activity vs exercising. Patient stated that although she is engaged in moving, she wants to engage in activities to increase her heart rate and make her sweat because she wants to lose weight. Patient is  interested in incorporating cardio using her treadmill 2-3xs/week for 15 minutes during her lunch break.   Discussed with patient the idea of creating a schedule to work towards balancing her time to determine where she has pockets of time during the day to incorporate various activities to help her incorporate exercise, new meal schedule, and adequate sleep. Patient is interested in trying this strategy. Will email patient an example of a schedule.   Patient will implement actions steps as outlined below over the next two weeks.  Overall Goal(s): Improve healthy eating habits   Stress management   Agreement/Action Steps:  Reduce Sodium Intake Continue to use Mrs. Dash/Salt free Seasonings Read food labels Practice portion control  Make healthier food choices Meal plan/prep for 2-3 days at a time Choose healthier snacks with less sugar  Self-care Walk on treadmill 2-3xs/week for 15 minutes during her lunch break. Write out daily schedule to determine times she can engage in activities for herself   Attempted: Fulfilled - Patient is meal planning/prepping, continues to use salt free seasonings, and Mrs. Dash. Patient is practicing portion control.  Partial - Patient did try to eat breakfast in the morning on some occasions, but had adverse reactions.  Not met - Patient has not been able to start walking in the morning for 10 minutes.

## 2022-08-27 NOTE — Telephone Encounter (Signed)
The following was sent to the provider team of Dr Oval Linsey, Tommy Medal, and Laurann Montana:  I wanted to make you all aware of one of the [facility] patient's BP trends. Ms Colleen Garrett, DOB 14-Jul-1986, has a current 2 week average BP of 165/109. I know our team at [facility] has reached out to her several times to inquire how she felt, but she routinely says she feels ok. Last med change was on 1/4, which was 2 weeks ago today. Next office appt 2/15.   Thanks,  Rosalene Billings

## 2022-08-28 MED ORDER — SPIRONOLACTONE 25 MG PO TABS: 25.0000 mg | ORAL_TABLET | Freq: Every day | ORAL | 3 refills | Status: DC

## 2022-08-28 NOTE — Addendum Note (Signed)
Addended by: Gerald Stabs on: 08/28/2022 11:29 AM   Modules accepted: Orders

## 2022-08-28 NOTE — Telephone Encounter (Signed)
BP not at goal <130/80. Recommend addition of Spironolactone 25mg  daily with BMP in 1 week.  Loel Dubonnet, NP

## 2022-08-28 NOTE — Addendum Note (Signed)
Addended by: Gerald Stabs on: 08/28/2022 11:33 AM   Modules accepted: Orders

## 2022-08-28 NOTE — Telephone Encounter (Signed)
Called patient to provide the following recommendations, no answer, left message with the following recommendations (ok per DPR). Rx to pharmacy and labs mailed to patient.    "BP not at goal <130/80. Recommend addition of Spironolactone 25mg  daily with BMP in 1 week.   Loel Dubonnet, NP"

## 2022-09-10 ENCOUNTER — Ambulatory Visit: Payer: No Typology Code available for payment source | Attending: Internal Medicine

## 2022-09-10 ENCOUNTER — Telehealth: Payer: Self-pay

## 2022-09-10 DIAGNOSIS — Z0000 Encounter for general adult medical examination without abnormal findings: Secondary | ICD-10-CM

## 2022-09-10 NOTE — Telephone Encounter (Signed)
Called patient to hold health coaching appointment over the phone as scheduled. Left message for patient to return call to hold session today or to reschedule.   Avelino Leeds, MS, ERHD, [facility]  Care Guide, Health & Wellness Coach [address], [address] [place] [address] Telephone: [phone] Email: [email or web address]

## 2022-09-14 ENCOUNTER — Telehealth: Payer: Self-pay

## 2022-09-14 MED ORDER — CHLORTHALIDONE 25 MG PO TABS: 25.0000 mg | ORAL_TABLET | Freq: Every day | ORAL | 6 refills | Status: DC

## 2022-09-14 NOTE — Telephone Encounter (Signed)
Returned call to patient and provided  the following recommendations. Patient is agreeable to new RX and will work to get labs done!    "She does need to go ahead and have repeat BMP after being on Amlodipine-Olmesartan. Orders previously placed.    Recommend start Chlorthalidone 25mg  daily to help lower blood pressure.    Loel Dubonnet, NP"

## 2022-09-14 NOTE — Addendum Note (Signed)
Addended by: Gerald Stabs on: 09/14/2022 02:12 PM   Modules accepted: Orders

## 2022-09-14 NOTE — Telephone Encounter (Signed)
She does need to go ahead and have repeat BMP after being on Amlodipine-Olmesartan. Orders previously placed.   Recommend start Chlorthalidone 25mg  daily to help lower blood pressure.   Loel Dubonnet, NP

## 2022-09-14 NOTE — Addendum Note (Signed)
Addended by: Gerald Stabs on: 09/14/2022 02:19 PM   Modules accepted: Orders

## 2022-09-14 NOTE — Telephone Encounter (Signed)
Prescription will not efax to pharmacy, rx faxed to pharmacy.

## 2022-09-14 NOTE — Telephone Encounter (Signed)
Spoke with patient regarding elevated BP readings from the [facility] Portal. The two week average BP is 162/105 and HR 82. Her last medication change was on 1/19. She has not been experiencing any symptoms and she has been exercising and watching her sodium intake. I let her know that I would notify the provider to see if any changes could be made.

## 2022-09-24 ENCOUNTER — Ambulatory Visit (HOSPITAL_BASED_OUTPATIENT_CLINIC_OR_DEPARTMENT_OTHER): Payer: No Typology Code available for payment source | Admitting: Family

## 2022-09-24 NOTE — Progress Notes (Signed)
Sent patient a message via [facility] portal to determine her interest in continuing health coaching. Patient requested to be scheduled for 2/19 at 10:00am. Patient has been scheduled and will be called at this time.    Avelino Leeds, MS, ERHD, [facility]  Care Guide, Health & Wellness Coach [address], [address] [place] [address] Telephone: [phone] Email: [email or web address]

## 2022-09-24 NOTE — Progress Notes (Deleted)
Advanced Hypertension Clinic Initial Assessment:    Date:  09/24/2022   ID:  Colleen Garrett, DOB 1986/05/24, MRN [id number]  PCP:  Elie Confer, NP  Cardiologist:  None  Nephrologist:  Referring MD: Elie Confer, NP   CC: Hypertension  History of Present Illness:    Colleen Garrett is a 37 y.o. female with a hx of anxiety, hypertension, cholelithiasis here to follow up in the Advanced Hypertension Clinic.   She was seen by [facility] surgery with plan for surgery due to symptomatic cholelithiasis however noted to have severe hypertension in clinic without associated symptoms.  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 Colleen Garrett was diagnosed with hypertension and took hydrochlorothiazide many years ago but was bothered by urinary frequency.  Tells me her blood pressure was previously better controlled when she was on Saxenda for weight loss prescribed at primary care that she had GI side effects and had to discontinue.  She has not seen her primary care provider in a year and a half.  Reports no prior tobacco use though does use marijuana.  No formal exercise routine. She eats at home and outside of the home and does not follow low sodium diet.   Reports no shortness of breath nor dyspnea on exertion. Reports no chest pain, pressure, or tightness. No edema, orthopnea, PND. Reports no palpitations.  Notes some morning nausea which she associates with her galbladder. Does not snore that she is aware of. Wakes feeling well rested.   She notes a strong family history of cardiac disease.  She is excited to take better control of her health.  Her sister recently had a stroke and is presently hospitalized.  Her brother has had multiple heart surgeries and heart disease.  Her mother passed away of an MI.  Previous antihypertensives: HCTZ - urinary frequency  Past Medical History:  Diagnosis Date   Chlamydia     Hypertension    Obesity    UTI (lower urinary tract infection)    proteus UTI    No past surgical history on file.  Current Medications: No outpatient medications have been marked as taking for the 09/24/22 encounter (Appointment) with Loel Dubonnet, NP.     Allergies:   Doxycycline   Social History   Socioeconomic History   Marital status: Single    Spouse name: Not on file   Number of children: Not on file   Years of education: Not on file   Highest education level: Not on file  Occupational History   Not on file  Tobacco Use   Smoking status: Never   Smokeless tobacco: Never  Substance and Sexual Activity   Alcohol use: No   Drug use: Yes    Types: Marijuana    Comment: daily   Sexual activity: Yes    Birth control/protection: None    Comment: last contact 4.17.2013  Other Topics Concern   Not on file  Social History Narrative   Not on file   Social Determinants of Health   Financial Resource Strain: Not on file  Food Insecurity: No Food Insecurity (07/23/2022)   Hunger Vital Sign    Worried About Running Out of Food in the Last Year: Never true    Ran Out of Food in the Last Year: Never true  Transportation Needs: No Transportation Needs (07/23/2022)   PRAPARE - Hydrologist (Medical):  No    Lack of Transportation (Non-Medical): No  Physical Activity: Insufficiently Active (07/23/2022)   Exercise Vital Sign    Days of Exercise per Week: 7 days    Minutes of Exercise per Session: 20 min  Stress: Not on file  Social Connections: Not on file     Family History: The patient's family history includes Coronary artery disease in her brother; Heart attack in her father and mother; Hyperlipidemia in her brother and father; Hypertension in her father and mother; Obesity in her sister; Skin cancer in her brother; Stroke in her sister.  ROS:   Please see the history of present illness.     All other systems reviewed and are  negative.  EKGs/Labs/Other Studies Reviewed:    EKG:  EKG is  ordered today.  The ekg ordered today demonstrates NSR 82 bpm with no acute ST/treated changes.  Recent Labs: 07/23/2022: ALT 14; BUN 7; Creatinine, Ser 0.92; Potassium 3.7; Sodium 136; TSH 0.693   Recent Lipid Panel    Component Value Date/Time   CHOL 115 07/23/2022 1009   TRIG 73 07/23/2022 1009   HDL 34 (L) 07/23/2022 1009   CHOLHDL 3.4 07/23/2022 1009   LDLCALC 66 07/23/2022 1009    Physical Exam:   VS:  There were no vitals taken for this visit. , BMI There is no height or weight on file to calculate BMI. GENERAL:  Well appearing, overweight HEENT: Pupils equal round and reactive, fundi not visualized, oral mucosa unremarkable NECK:  No jugular venous distention, waveform within normal limits, carotid upstroke brisk and symmetric, no bruits, no thyromegaly LYMPHATICS:  No cervical adenopathy LUNGS:  Clear to auscultation bilaterally HEART:  RRR.  PMI not displaced or sustained,S1 and S2 within normal limits, no S3, no S4, no clicks, no rubs, no murmurs ABD:  Flat, positive bowel sounds normal in frequency in pitch, no bruits, no rebound, no guarding, no midline pulsatile mass, no hepatomegaly, no splenomegaly EXT:  2 plus pulses throughout, no edema, no cyanosis no clubbing SKIN:  No rashes no nodules NEURO:  Cranial nerves II through XII grossly intact, motor grossly intact throughout PSYCH:  Cognitively intact, oriented to person place and time   ASSESSMENT/PLAN:    HTN - BP not at goal <130/80.  Start amlodipine-olmesartan 5-40 mg daily.  She is hesitant regarding diuretics as she reports previous urinary frequency when taking hydrochlorothiazide.  She does note difficulty swallowing pills and prefers a combination tablet.  Labs today for secondary workup including CMP, cortisol, catecholamines, metanephrines, TSH.  Renal artery duplex to rule out renal artery stenosis.  No snoring or daytime somnolence  suggestive of sleep apnea however could consider in future if BP remains difficult to control due to body habitus. Referred to PREP exercise program at [facility].  She was enrolled in remote patient monitoring study today and provided home blood pressure cuff.  Family history of cardiovascular disease - Lipid panel, CMP, lipoprotein a today. No anginal symptoms and EKG with no acute ST/T wave changes. Could consider coronary calcium score at follow up for screening for coronary artery disease. Heart healthy diet and regular cardiovascular exercise encouraged.    Morbid obesity - Weight loss via diet and exercise encouraged. Discussed the impact being overweight would have on cardiovascular risk. Previously on Saxenda >1 year ago but did not tolerate due to GI side effects. Encouraged to re-establish with PCP. Could consider GLP1 for weight loss.  Anxiety - Continue to follow with PCP.   Cholelithiasis -  Pending surgery with [place] and was referred for hypertension. Management as above.***  Screening for Secondary Hypertension:     07/23/2022   10:15 AM  Causes  Drugs/Herbals Screened     - Comments marijuana  Renovascular HTN Screened  Thyroid Disease Screened  Hyperaldosteronism Screened  Pheochromocytoma Screened  Cushing's Syndrome Screened    Relevant Labs/Studies:    Latest Ref Rng & Units 07/23/2022   10:09 AM 07/22/2020   10:37 AM 07/22/2020   10:30 AM  Basic Labs  Sodium 134 - 144 mmol/L 136  136  136   Potassium 3.5 - 5.2 mmol/L 3.7  3.4  3.4   Creatinine 0.57 - 1.00 mg/dL 0.92  1.10  1.60        Latest Ref Rng & Units 07/23/2022   10:09 AM  Thyroid   TSH 0.450 - 4.500 uIU/mL 0.693           Latest Ref Rng & Units 07/23/2022   10:09 AM  Metanephrines/Catecholamines   Epinephrine 0 - 62 pg/mL <15   Norepinephrine 0 - 874 pg/mL 325   Dopamine 0 - 48 pg/mL <30   Metanephrines 0.0 - 88.0 pg/mL <25.0   Normetanephrines  0.0 - 210.1 pg/mL 63.3            08/20/2022   11:04 AM  Renovascular   Renal Artery [place] Completed Yes      she consents to be monitored in our remote patient monitoring program through [place].  she will track his blood pressure twice daily and understands that these trends will help [place] to adjust her medications as needed prior to his next appointment.  she  interested in enrolling in the PREP exercise and nutrition program through the [facility].  ***   Disposition:    FU with APP in 2 months and with MD in 4 months***   Medication Adjustments/Labs and Tests Ordered: Current medicines are reviewed at length with the patient today.  Concerns regarding medicines are outlined above.  No orders of the defined types were placed in this encounter.  No orders of the defined types were placed in this encounter.    Signed, Loel Dubonnet, NP  09/24/2022 8:00 AM    [facility]

## 2022-09-28 ENCOUNTER — Telehealth (HOSPITAL_BASED_OUTPATIENT_CLINIC_OR_DEPARTMENT_OTHER): Payer: Self-pay | Admitting: Family

## 2022-09-28 ENCOUNTER — Ambulatory Visit (HOSPITAL_BASED_OUTPATIENT_CLINIC_OR_DEPARTMENT_OTHER): Payer: No Typology Code available for payment source

## 2022-09-28 DIAGNOSIS — Z0000 Encounter for general adult medical examination without abnormal findings: Secondary | ICD-10-CM

## 2022-09-28 NOTE — Telephone Encounter (Signed)
Received note from [facility] "2 week average 154/101. SBP running 160-170's, DBP running 100-119, last med change 2/5"  On 07/25/23 started on Amlodipine-Olmesartan On 09/14/22 started on Chlorthalidone.   She has not had repeat BMP as previously instructed multiple times. Hesitant to further change medications prior to knowing renal function, electrolytes.   Will ask nursing team to call her to remind her to have labs done Mon-Thurs of THIS week.  Message additionally sent via [place].    Loel Dubonnet, NP

## 2022-09-28 NOTE — Progress Notes (Signed)
Appointment Outcome: Completed, Session #: 3                        Start time: 10:08am   End time: 10:42am   Total Mins: 34 minutes  AGREEMENTS SECTION   Overall Goal(s): Improve healthy eating habits   Stress management   Agreement/Action Steps:  Reduce Sodium Intake Continue to use Mrs. Dash/Salt free Seasonings Read food labels Practice portion control   Make healthier food choices Meal plan/prep for 2-3 days at a time Choose healthier snacks with less sugar   Self-care Walk on treadmill 2-3xs/week for 15 minutes during her lunch break. Write out daily schedule to determine times she can engage in activities for herself    Progress Notes:  Patient stated that she is consuming less sugar, she finds that certain foods are much sweeter to taste. Patient reported that she has been meal prepping for four days at a time and recognize that by the last day, she is tired of eating the same meal. Patient mentioned that she has decided to meal prep twice a week and to cook two different meals to help maintain this practice.  Patient shared that she has incorporated talking herself through various times to help her choose healthier options to eat such as snacking on fruit. Patient stated that she has been reading food labels and shared an example of a serving size that she was surprised about, but helped her pay more attention to how much of something she eats according to serving sizes.   Patient expressed that instead of removing things from her diet at this time, she has been working on adding healthier foods. Patient shared that she has been consuming more vegetables (e.g. broccoli) prior to eating the rest of her meal. Patient reported that she is not eating a late meal after 10pm and tries to eat around 7pm when she takes her medications. Patient stated that if she does start to feel hungry then she will have a cup of applesauce.   Patient stated that she is now eating breakfast and  enjoys [place] sausage. Patient mentioned that she has gotten used to the taste of Mrs. Dash and has not had any issues with seasoning her food. Patient shared that due to meal planning/prepping she is able to maintain her eating habits even when she is at a gathering with her friends.   Patient reported that her stress level is 3/10 (10 being the highest). Patient stated that she has worked on resolving most of her major stressors. Patient shared that she has been communicating with her sister about various concerns regarding care giving and has set boundaries on the amount of time that she allots to provide assistance. Patient stated that she shared her need to prioritize her health and focus on recovery after surgery.  Patient mentioned that she has been incorporating more physical activity daily. Patient shared that during her work breaks, she is standing up instead of sitting or cleaning up around the house. Patient mentioned that she stands more and move about while at church as well. 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 Patient mentioned that she has been walking on the treadmill twice a week for 15 minutes.    Indicators of Success and Accountability:  Patient has begun to incorporate more physical activity and is cooking more at home.   Readiness: Patient is in the action stage of improving  healthy eating habits and managing stress.   Strengths and Supports: Patient is being supported by friends. Patient is relying on being assertive, optimistic and a planner.   Challenges and Barriers: Patient does not foresee any challenges/barriers to implementing her action steps over the next two weeks.    Coaching Outcomes: Patient will continue to implement her action steps as outlined above over the next two weeks.   Patient is feeling confident with the action steps that are in place and did not make changes.  Attempted: Fulfilled - patient completed  the bi-weekly agreement in full and was able to meet the challenge.

## 2022-09-28 NOTE — Telephone Encounter (Signed)
Called patient to remind her to get labs done by Thursday of this week. Patient verbalizes understanding and will come get labs tomorrow.

## 2022-09-29 ENCOUNTER — Other Ambulatory Visit (HOSPITAL_BASED_OUTPATIENT_CLINIC_OR_DEPARTMENT_OTHER): Payer: Self-pay | Admitting: Family

## 2022-09-30 ENCOUNTER — Telehealth (HOSPITAL_BASED_OUTPATIENT_CLINIC_OR_DEPARTMENT_OTHER): Payer: Self-pay

## 2022-09-30 DIAGNOSIS — I10 Essential (primary) hypertension: Secondary | ICD-10-CM

## 2022-09-30 LAB — BASIC METABOLIC PANEL
BUN/Creatinine Ratio: 9 (ref 9–23)
BUN: 7 mg/dL (ref 6–20)
CO2: 22 mmol/L (ref 20–29)
Calcium: 9.5 mg/dL (ref 8.7–10.2)
Chloride: 105 mmol/L (ref 96–106)
Creatinine, Ser: 0.78 mg/dL (ref 0.57–1.00)
Glucose: 99 mg/dL (ref 70–99)
Potassium: 3.9 mmol/L (ref 3.5–5.2)
Sodium: 141 mmol/L (ref 134–144)
eGFR: 102 mL/min/{1.73_m2} (ref >59)

## 2022-09-30 NOTE — Telephone Encounter (Addendum)
Returned call to patient and provided the following recommendations. Patient states she does not take spironolactone and has been taking amlo-olm 5-40, not 10-40. Will route back to provider for update and to make further adjustments.      ----- Message from Loel Dubonnet, NP sent at 09/30/2022  7:40 AM EST ----- Normal kidney function and electrolytes.   For improved BP control increase Spironolactone to 50  QD and repeat BMP in 1-2 weeks.

## 2022-10-01 NOTE — Telephone Encounter (Signed)
Call to patient's pharmacy who reported pt did not pick up either the amio-olemsartan or the Spironolactone. Per secure chat from Laurann Montana NP, "If she calls back would just have her start with amlo-olmesartan 10-40." Terry

## 2022-10-01 NOTE — Telephone Encounter (Signed)
On 08/13/22 she was instructed to start Amlodipine-Olmesartan 10-49m QD  On 08/28/22 she was instructed to start Spironolactone  Will ask nursing team to call pharmacy and confirm whether she picked those medications up. Unclear why she did not start.   Will ask nursing team to call patient once information from pharmacy obtained.   If truly only taking Amlodipine-Olmesartan 5-473mQD ?  increase to 10-409mose. If taking 10-50m2mse ? add Spironolactone 25mg50mI taking 10-50mg 35m and Spironolactone 25mg Q54mincrease Spironolactone to 50mg [id number] Loel Dubonnet

## 2022-10-02 MED ORDER — AMLODIPINE-OLMESARTAN 10-40 MG PO TABS: 1.0000 | ORAL_TABLET | Freq: Every day | ORAL | 5 refills | Status: DC

## 2022-10-02 NOTE — Telephone Encounter (Signed)
She is only taking Amlodipine-Valsartan 5-'40mg'$  QD. Notes she was unaware of increased dose as it was sent to [facility] instead of [facility] which she uses. She is not taking Spironolactone nor Chlorthalidone. As Spironolactone and Chlorthalidone were only added when we thought BP not improved on max dose Amlodipine-Valsartan, will remove from her medication list.   Rx for Amlodipine-Valsartan 10-'40mg'$  [facility]. She is agreeable to start. Will check in via 1 week to reassess BP.   Loel Dubonnet, NP

## 2022-10-02 NOTE — Addendum Note (Signed)
Addended by: Loel Dubonnet on: 10/02/2022 01:14 PM   Modules accepted: Orders

## 2022-10-06 NOTE — Progress Notes (Signed)
Surgery orders requested via Epic inbox. °

## 2022-10-07 ENCOUNTER — Ambulatory Visit: Payer: Self-pay | Admitting: Surgery

## 2022-10-08 MED ORDER — SPIRONOLACTONE 25 MG PO TABS: 25.0000 mg | ORAL_TABLET | Freq: Every day | ORAL | 3 refills | Status: DC

## 2022-10-08 NOTE — Telephone Encounter (Signed)
BP reviewed since max dose Amlodipine-Olmesartan. Not yet at goal <130/80.   Recommend start Spironolactone '25mg'$  QD with BMP in 1 week. [facility].  Loel Dubonnet, NP

## 2022-10-08 NOTE — Addendum Note (Signed)
Addended by: Gerald Stabs on: 10/08/2022 01:28 PM   Modules accepted: Orders

## 2022-10-08 NOTE — Telephone Encounter (Signed)
Returned call to patient and reviewed the following recommendations. Patient is agreeable to med changes and verbalizes understanding.    "BP reviewed since max dose Amlodipine-Olmesartan. Not yet at goal <130/80.    Recommend start Spironolactone '25mg'$  QD with BMP in 1 week. [facility].   Loel Dubonnet, NP"

## 2022-10-10 NOTE — Patient Instructions (Signed)
SURGICAL WAITING ROOM VISITATION Patients having surgery or a procedure may have no more than 2 support people in the waiting area - these visitors may rotate in the visitor waiting room.   Due to an increase in RSV and influenza rates and associated hospitalizations, children ages 81 and under may not visit patients in [place]. If the patient needs to stay at the hospital during part of their recovery, the visitor guidelines for inpatient rooms apply.  PRE-OP VISITATION  Pre-op nurse will coordinate an appropriate time for 1 support person to accompany the patient in pre-op.  This support person may not rotate.  This visitor will be contacted when the time is appropriate for the visitor to come back in the pre-op area.  Please refer to the [facility] website for the visitor guidelines for Inpatients (after your surgery is over and you are in a regular room).  You are not required to quarantine at this time prior to your surgery. However, you must do this: Hand Hygiene often Do NOT share personal items Notify your provider if you are in close contact with someone who has COVID or you develop fever 100.4 or greater, new onset of sneezing, cough, sore throat, shortness of breath or body aches.  If you test positive for Covid or have been in contact with anyone that has tested positive in the last 10 days please notify you surgeon.    Your procedure is scheduled on:  Monday  October 26, 2022   Report to [facility] Main Entrance: [place] entrance where the [employer] is available.   Report to admitting at: 05:15   AM  +++++Call this number if you have any questions or problems the morning of surgery [phone]  Do not eat food after Midnight the night prior to your surgery/procedure.  After Midnight you may have the following liquids until   04:30  AM  DAY OF SURGERY  Clear Liquid Diet Water Black Coffee (sugar ok, NO MILK/CREAM OR CREAMERS)  Tea (sugar ok, NO  MILK/CREAM OR CREAMERS) regular and decaf                             Plain Jell-O  with no fruit (NO RED)                                           Fruit ices (not with fruit pulp, NO RED)                                     Popsicles (NO RED)                                                                  Juice: apple, WHITE grape, WHITE cranberry Sports drinks like Gatorade or Powerade (NO RED)                FOLLOW BOWEL PREP AND ANY ADDITIONAL PRE OP INSTRUCTIONS YOU RECEIVED FROM YOUR SURGEON'S OFFICE!!!   Oral Hygiene is also important to reduce  your risk of infection.        Remember - BRUSH YOUR TEETH THE MORNING OF SURGERY WITH YOUR REGULAR TOOTHPASTE  Do NOT smoke after Midnight the night before surgery.  Take ONLY these medicines the morning of surgery with A SIP OF WATER: escitalopram (Lexapro), Buspar                     You may not have any metal on your body including hair pins, jewelry, and body piercing  Do not wear make-up, lotions, powders, perfumes  or deodorant  Do not wear nail polish including gel and S&S, artificial / acrylic nails, or any other type of covering on natural nails including finger and toenails. If you have artificial nails, gel coating, etc., that needs to be removed by a nail salon, Please have this removed prior to surgery. Not doing so may mean that your surgery could be cancelled or delayed if the Surgeon or anesthesia staff feels like they are unable to monitor you safely.   Do not shave 48 hours prior to surgery to avoid nicks in your skin which may contribute to postoperative infections.   Patients discharged on the day of surgery will not be allowed to drive home.  Someone NEEDS to stay with you for the first 24 hours after anesthesia.  Do not bring your home medications to the hospital. The Pharmacy will dispense medications listed on your medication list to you during your admission in the Hospital.   Please read over the following  fact sheets you were given: IF YOU HAVE QUESTIONS ABOUT YOUR [place], [place] [phone].   [place] - Preparing for Surgery Before surgery, you can play an important role.  Because skin is not sterile, your skin needs to be as free of germs as possible.  You can reduce the number of germs on your skin by washing with CHG (chlorahexidine gluconate) soap before surgery.  CHG is an antiseptic cleaner which kills germs and bonds with the skin to continue killing germs even after washing. Please DO NOT use if you have an allergy to CHG or antibacterial soaps.  If your skin becomes reddened/irritated stop using the CHG and inform your nurse when you arrive at Short Stay. Do not shave (including legs and underarms) for at least 48 hours prior to the first CHG shower.  You may shave your face/neck.  Please follow these instructions carefully:  1.  Shower with CHG Soap the night before surgery and the  morning of surgery.  2.  If you choose to wash your hair, wash your hair first as usual with your normal  shampoo.  3.  After you shampoo, rinse your hair and body thoroughly to remove the shampoo.                             4.  Use CHG as you would any other liquid soap.  You can apply chg directly to the skin and wash.  Gently with a scrungie or clean washcloth.  5.  Apply the CHG Soap to your body ONLY FROM THE NECK DOWN.   Do not use on face/ open                           Wound or open sores. Avoid contact with eyes, ears mouth and genitals (private parts).  Wash face,  Genitals (private parts) with your normal soap.             6.  Wash thoroughly, paying special attention to the area where your  surgery  will be performed.  7.  Thoroughly rinse your body with warm water from the neck down.  8.  DO NOT shower/wash with your normal soap after using and rinsing off the CHG Soap.            9.  Pat yourself dry with a clean towel.            10.  Wear clean  pajamas.            11.  Place clean sheets on your bed the night of your first shower and do not  sleep with pets.  ON THE DAY OF SURGERY : Do not apply any lotions/deodorants the morning of surgery.  Please wear clean clothes to the hospital/surgery center.    FAILURE TO FOLLOW THESE INSTRUCTIONS MAY RESULT IN THE CANCELLATION OF YOUR SURGERY  PATIENT SIGNATURE_________________________________  NURSE SIGNATURE__________________________________  ________________________________________________________________________

## 2022-10-10 NOTE — Progress Notes (Addendum)
COVID Vaccine received:  []  No [x]  Yes Date of any COVID positive Test in last 90 days: None  PCP - Drue Dun, NP Cardiologist - Chilton Si, MD  (Pt in [facility] clinic Research study)  Chest x-ray -  EKG -  07-23-2022  epic Stress Test -  ECHO -  Cardiac Cath -   PCR screen: []  Ordered & Completed                      []   No Order but Needs PROFEND                      [x]   N/A for this surgery  Surgery Plan:  [x]  Ambulatory                            []  Outpatient in bed                            []  Admit  Anesthesia:    [x]  General  []  Spinal                           []   Choice []   MAC  Bowel Prep - [x]  No  []   Yes ______  Pacemaker / ICD device [x]  No []  Yes        Device order form faxed [x]  No    []   Yes      Faxed to:  Spinal Cord Stimulator:[x]  No []  Yes      (Remind patient to bring remote DOS) Other Implants:   History of Sleep Apnea? [x]  No []  Yes   CPAP used?- [x]  No []  Yes    Does the patient monitor blood sugar? []  No []  Yes  [x]  N/A  Blood Thinner / Instructions: none Aspirin Instructions:  none  ERAS Protocol Ordered: []  No  [x]  Yes verbal order from [place] at [facility]. PRE-SURGERY []  ENSURE  []  G2  [x]  No Drink Ordered   Patient is to be NPO after: 04:30 am  Comments: Patient has never had any anesthesia or surgical procedures. She is very anxious about this surgery  Steward Drone at [facility] gave me a verbal order to have patient be ERAS (no drink). Dr. Bedelia Person did not put a diet order in but Steward Drone is sending her a message to add ERAS to orders. Steward Drone had put ERAS in the special needs section of the surgery booking.   Activity level: Patient is able to climb a flight of stairs without difficulty; [x]  No CP  [x]  No SOB. Patient can perform ADLs without assistance.   Anesthesia review: HTN, anxiety, anemia  Patient denies shortness of breath, fever, cough and chest pain at PAT appointment.  Patient verbalized understanding and agreement to the  Pre-Surgical Instructions that were given to them at this PAT appointment. Patient was also educated of the need to review these PAT instructions again prior to his/her surgery.I reviewed the appropriate phone numbers to call if they have any and questions or concerns.

## 2022-10-12 ENCOUNTER — Ambulatory Visit: Payer: No Typology Code available for payment source | Attending: Cardiovascular Disease

## 2022-10-12 DIAGNOSIS — Z0000 Encounter for general adult medical examination without abnormal findings: Secondary | ICD-10-CM

## 2022-10-12 NOTE — Progress Notes (Signed)
Appointment Outcome: Completed, Session #: 4                        Start time: 10:01am   End time: 10:32am   Total Mins: 31 minutes  AGREEMENTS SECTION   Overall Goal(s): Improve healthy eating habits   Stress management   Agreement/Action Steps:  Reduce Sodium Intake Continue to use Mrs. Dash/Salt free Seasonings Read food labels Practice portion control   Make healthier food choices Meal plan/prep for 2-3 days at a time Choose healthier snacks with less sugar   Self-care Walk on treadmill 2-3xs/week for 15 minutes during her lunch break. Write out daily schedule to determine times she can engage in activities for herself    Progress Notes:  Patient mentioned that meal planning and prepping is going well. Patient stated that she cooks a whole chicken and vegetables to divide up into smaller meals. Patient shared that she consumes vegetables every day. Patient stated that she eats more frozen vegetables versus canned goods due to the salt content. Patient expressed that although she rinsed off the canned vegetables, it was not working for her. Patient shared that it is easier to roast frozen vegetables in the air fryer or in the oven. Patient reported that she has been able to continue eating breakfast regularly but choose not to on Sundays.   Patient stated that she continues to use salt-free seasonings, such as Mrs. Dash lemon pepper, garlic powder, and onion powder. Patient stated that the taste of the food is boring, but not horrible. Patient stated that she has been able to maintain not eating after 10pm nightly because she is meal planning/prepping. Patient stated that since she does not go to bed right after getting off work, she drinks sugar-free drinks to fill her stomach. Patient stated that she keeps her hands and mind busy after work to avoid wanting to eat out of boredom or stress.   Patient shared an example of her choosing to eat 4 cookies rather than eating fruit.  Patient stated that she tries to talk herself out of this behavior, which works sometimes, but she is incorporating negative self-talk to deter herself from eating certain foods. Patient stated that at times, she finds that she tries to justify eating sweets.  Patient stated that checking her blood pressure and seeing her numbers hold her accountable to engage in healthy behaviors such as monitoring her sodium intake and engaging in more physical activity. Patient reported that she has been walking a dog last week twice daily for 15 minutes each time. Patient stated that now she walks to check her mailbox and dispose of her trash instead of walking. Patient mentioned that now she doesn't find walking to these areas a daunting task. Patient expressed how she did not feel comfortable exercising/walking around in front of others but has since changed her perspective and focused on her wanting to improve her health.  Patient shared that she is interested in lifting weights and is now aware that they have the equipment available at her apartment complex's exercise center. Patient mentioned that she will Google how to use the weights and various exercises that she can do every day. Patient is finding motivation from seeing her neighbors walking around the apartment complex that are older than her, which aids in keeping her accountable with the physical activities she's currently engaged in.  Patient rated her currently stress level at a 3/10 due to her upcoming medical procedure. Patient  stated that otherwise, she is not allowing other things to make her feel stressed.    Indicators of Success and Accountability:  Patient has been able to maintain eating before 10pm nightly due to meal planning/prepping and avoiding snacking after work. Patient is also finding ways to hold herself accountable to implementing her action steps.  Readiness: Patient is in the active stage of improving healthy eating habits and  managing stress.   Strengths and Supports: Patient is relying on her ability to remain consistent and setting healthy boundaries so that she can prioritize her health.   Challenges and Barriers: Patient will have an outpatient procedure performed that may impact her eating habits and physical activity during recovery.   Coaching Outcomes: Patient will continue to implement action steps as outlined above over the next two weeks until March 18th with the following modifications to self-care outlined below:   Self-care Engage in physical activity 2-3xs/week for a minimum of 15 minutes. Walk dogs Walk to mailbox and/or trash disposal Use weights at apartment's exercise center daily Write in journal a minimum of twice weekly.   Patient will have to modify physical activity and any necessary eating habits based on the surgeon's recommendations during recovery. Will review those recommendations with patient during the next health coaching session.   Discussed with patient the strategy of incorporating positive self-talk to deter unhealthy eating habits or to encourage herself to put in more effort on the following day if she had a day that she felt she could have improved.   Discussed with patient the idea of writing about her day to reflect on how she has managed stress or focus on practicing gratitude to counter negative thoughts. Patient stated that she has written in a journal before and would writing twice a week before bed.   Patient has been scheduled a few days past March 18th to give her an opportunity to begin recovering from her surgery.     Attempted: Fulfilled - Patient has been able to continue using salt free seasonings, read food labels and practice portion control to reduce sodium intake. Patient has been able to meal plan/prep without challenges. Patient is engaged in self-care through physical activity such as walking on multiple occasions.   Partial - Patient has a  challenge with choosing healthier snacks that continue less sugar on some occasions.

## 2022-10-13 ENCOUNTER — Other Ambulatory Visit: Payer: Self-pay

## 2022-10-13 ENCOUNTER — Encounter (HOSPITAL_COMMUNITY): Payer: Self-pay

## 2022-10-13 ENCOUNTER — Encounter (HOSPITAL_COMMUNITY)
Admission: RE | Admit: 2022-10-13 | Discharge: 2022-10-13 | Disposition: A | Discharge status: Home / Self Care | Payer: No Typology Code available for payment source | Source: Ambulatory Visit | Attending: Surgery | Admitting: Surgery

## 2022-10-13 VITALS — BP 154/99 | HR 79 | Temp 99.0°F | Resp 22 | Ht 66.0 in | Wt 342.0 lb

## 2022-10-13 DIAGNOSIS — Z01812 Encounter for preprocedural laboratory examination: Secondary | ICD-10-CM | POA: Diagnosis present

## 2022-10-13 DIAGNOSIS — I10 Essential (primary) hypertension: Secondary | ICD-10-CM | POA: Insufficient documentation

## 2022-10-13 HISTORY — DX: Anemia, unspecified: D64.9

## 2022-10-13 HISTORY — DX: Anxiety disorder, unspecified: F41.9

## 2022-10-13 HISTORY — DX: Depression, unspecified: F32.A

## 2022-10-13 LAB — BASIC METABOLIC PANEL
Anion gap: 6 (ref 5–15)
BUN: 8 mg/dL (ref 6–20)
CO2: 25 mmol/L (ref 22–32)
Calcium: 8.7 mg/dL — ABNORMAL LOW (ref 8.9–10.3)
Chloride: 105 mmol/L (ref 98–111)
Creatinine, Ser: 0.85 mg/dL (ref 0.44–1.00)
GFR, Estimated: >60 mL/min (ref >60)
Glucose, Bld: 93 mg/dL (ref 70–99)
Potassium: 3.8 mmol/L (ref 3.5–5.1)
Sodium: 136 mmol/L (ref 135–145)

## 2022-10-13 LAB — CBC
HCT: 34.7 % — ABNORMAL LOW (ref 36.0–46.0)
Hemoglobin: 10.4 g/dL — ABNORMAL LOW (ref 12.0–15.0)
MCH: 22.6 pg — ABNORMAL LOW (ref 26.0–34.0)
MCHC: 30 g/dL (ref 30.0–36.0)
MCV: 75.4 fL — ABNORMAL LOW (ref 80.0–100.0)
Platelets: 308 10*3/uL (ref 150–400)
RBC: 4.6 MIL/uL (ref 3.87–5.11)
RDW: 17.7 % — ABNORMAL HIGH (ref 11.5–15.5)
WBC: 5.2 10*3/uL (ref 4.0–10.5)
nRBC: 0 % (ref 0.0–0.2)

## 2022-10-14 ENCOUNTER — Other Ambulatory Visit (HOSPITAL_BASED_OUTPATIENT_CLINIC_OR_DEPARTMENT_OTHER): Payer: Self-pay | Admitting: Family

## 2022-10-14 DIAGNOSIS — I10 Essential (primary) hypertension: Secondary | ICD-10-CM

## 2022-10-14 MED ORDER — AMLODIPINE-OLMESARTAN 10-40 MG PO TABS: 1.0000 | ORAL_TABLET | Freq: Every day | ORAL | 1 refills | Status: DC

## 2022-10-14 NOTE — Telephone Encounter (Signed)
Loel Dubonnet, NP  to Me  Still recommend combo tab. However, may be on back order. Worthwhile to call pharmacy to confirm. If on back order - okay to refill as 2 separate Rx  ---Spoke to pharmacy who stated pt needed refill of combo tab, separated medications sent incorrectly. Pharmacy stated ok to deny those and to send refills in for combo tab. Refills sent.---

## 2022-10-14 NOTE — Telephone Encounter (Signed)
ADV HTN pt---she is on Azor, but pharmacy sent separated medications for refill. Want to make sure pt is still supposed to be taking combined meds (Azor) and not the separated meds.

## 2022-10-19 ENCOUNTER — Ambulatory Visit: Payer: Self-pay | Admitting: Surgery

## 2022-10-25 NOTE — Anesthesia Preprocedure Evaluation (Signed)
Anesthesia Evaluation  Patient identified by MRN, date of birth, ID band Patient awake    Reviewed: Allergy & Precautions, NPO status , Patient's Chart, lab work & pertinent test results  Airway Mallampati: III  TM Distance: >3 FB Neck ROM: Full    Dental  (+) Teeth Intact, Dental Advisory Given   Pulmonary neg pulmonary ROS   Pulmonary exam normal breath sounds clear to auscultation       Cardiovascular hypertension, Pt. on medications Normal cardiovascular exam Rhythm:Regular Rate:Normal     Neuro/Psych  PSYCHIATRIC DISORDERS Anxiety Depression    negative neurological ROS     GI/Hepatic negative GI ROS,,,SYMPTOMATIC CHOLELITHIASIS   Endo/Other    Morbid obesity (BMI 55)  Renal/GU negative Renal ROS     Musculoskeletal negative musculoskeletal ROS (+)    Abdominal   Peds  Hematology  (+) Blood dyscrasia, anemia   Anesthesia Other Findings   Reproductive/Obstetrics                             Anesthesia Physical Anesthesia Plan  ASA: 4  Anesthesia Plan: General   Post-op Pain Management: Tylenol PO (pre-op)* and Toradol IV (intra-op)*   Induction: Intravenous  PONV Risk Score and Plan: 4 or greater and Scopolamine patch - Pre-op, Midazolam, Dexamethasone and Ondansetron  Airway Management Planned: Oral ETT  Additional Equipment:   Intra-op Plan:   Post-operative Plan: Extubation in OR  Informed Consent: I have reviewed the patients History and Physical, chart, labs and discussed the procedure including the risks, benefits and alternatives for the proposed anesthesia with the patient or authorized representative who has indicated his/her understanding and acceptance.     Dental advisory given  Plan Discussed with: CRNA  Anesthesia Plan Comments:        Anesthesia Quick Evaluation

## 2022-10-26 ENCOUNTER — Ambulatory Visit (HOSPITAL_COMMUNITY): Payer: No Typology Code available for payment source | Admitting: Anesthesiology

## 2022-10-26 ENCOUNTER — Ambulatory Visit (HOSPITAL_COMMUNITY)
Admission: RE | Admit: 2022-10-26 | Discharge: 2022-10-26 | Disposition: A | Discharge status: Home / Self Care | Payer: No Typology Code available for payment source | Source: Ambulatory Visit | Attending: Surgery | Admitting: Surgery

## 2022-10-26 ENCOUNTER — Other Ambulatory Visit: Payer: Self-pay

## 2022-10-26 ENCOUNTER — Encounter (HOSPITAL_COMMUNITY)
Admission: RE | Disposition: A | Discharge status: Home / Self Care | Payer: Self-pay | Source: Ambulatory Visit | Attending: Surgery

## 2022-10-26 ENCOUNTER — Encounter (HOSPITAL_COMMUNITY): Payer: Self-pay | Admitting: Surgery

## 2022-10-26 ENCOUNTER — Ambulatory Visit (HOSPITAL_BASED_OUTPATIENT_CLINIC_OR_DEPARTMENT_OTHER): Payer: No Typology Code available for payment source | Admitting: Anesthesiology

## 2022-10-26 DIAGNOSIS — D649 Anemia, unspecified: Secondary | ICD-10-CM | POA: Diagnosis not present

## 2022-10-26 DIAGNOSIS — F418 Other specified anxiety disorders: Secondary | ICD-10-CM | POA: Diagnosis not present

## 2022-10-26 DIAGNOSIS — Z6841 Body Mass Index (BMI) 40.0 and over, adult: Secondary | ICD-10-CM | POA: Diagnosis not present

## 2022-10-26 DIAGNOSIS — K8010 Calculus of gallbladder with chronic cholecystitis without obstruction: Secondary | ICD-10-CM | POA: Diagnosis not present

## 2022-10-26 DIAGNOSIS — I10 Essential (primary) hypertension: Secondary | ICD-10-CM | POA: Insufficient documentation

## 2022-10-26 DIAGNOSIS — Z882 Allergy status to sulfonamides status: Secondary | ICD-10-CM

## 2022-10-26 DIAGNOSIS — K8020 Calculus of gallbladder without cholecystitis without obstruction: Secondary | ICD-10-CM | POA: Diagnosis not present

## 2022-10-26 DIAGNOSIS — Z79899 Other long term (current) drug therapy: Secondary | ICD-10-CM | POA: Diagnosis not present

## 2022-10-26 HISTORY — PX: CHOLECYSTECTOMY: SHX55

## 2022-10-26 LAB — POCT PREGNANCY, URINE: Preg Test, Ur: NEGATIVE

## 2022-10-26 SURGERY — LAPAROSCOPIC CHOLECYSTECTOMY
Anesthesia: General | Site: Abdomen

## 2022-10-26 MED ORDER — 0.9 % SODIUM CHLORIDE (POUR BTL) OPTIME
TOPICAL | Status: DC | PRN
  Administered 2022-10-26: 1000 mL

## 2022-10-26 MED ORDER — DOCUSATE SODIUM 100 MG PO CAPS: 100.0000 mg | ORAL_CAPSULE | Freq: Two times a day (BID) | ORAL | 2 refills | Status: DC

## 2022-10-26 MED ORDER — SODIUM CHLORIDE 0.9 % IR SOLN
Status: DC | PRN
  Administered 2022-10-26: 1000 mL

## 2022-10-26 MED ORDER — CHLORHEXIDINE GLUCONATE 0.12 % MT SOLN
15.0000 mL | Freq: Once | OROMUCOSAL | Status: AC
  Administered 2022-10-26: 15 mL via OROMUCOSAL

## 2022-10-26 MED ORDER — AMISULPRIDE (ANTIEMETIC) 5 MG/2ML IV SOLN
INTRAVENOUS | Status: AC
  Filled 2022-10-26: qty 4

## 2022-10-26 MED ORDER — ONDANSETRON HCL 4 MG/2ML IJ SOLN
INTRAMUSCULAR | Status: AC
  Filled 2022-10-26: qty 2

## 2022-10-26 MED ORDER — SCOPOLAMINE 1 MG/3DAYS TD PT72
1.0000 | MEDICATED_PATCH | Freq: Once | TRANSDERMAL | Status: DC
  Administered 2022-10-26: 1.5 mg via TRANSDERMAL
  Filled 2022-10-26: qty 1

## 2022-10-26 MED ORDER — DEXAMETHASONE SODIUM PHOSPHATE 10 MG/ML IJ SOLN
INTRAMUSCULAR | Status: DC | PRN
  Administered 2022-10-26: 10 mg via INTRAVENOUS

## 2022-10-26 MED ORDER — FENTANYL CITRATE PF 50 MCG/ML IJ SOSY
PREFILLED_SYRINGE | INTRAMUSCULAR | Status: AC
  Administered 2022-10-26: 50 ug via INTRAVENOUS
  Filled 2022-10-26: qty 3

## 2022-10-26 MED ORDER — KETOROLAC TROMETHAMINE 30 MG/ML IJ SOLN
INTRAMUSCULAR | Status: AC
  Filled 2022-10-26: qty 1

## 2022-10-26 MED ORDER — KETOROLAC TROMETHAMINE 30 MG/ML IJ SOLN
INTRAMUSCULAR | Status: DC | PRN
  Administered 2022-10-26: 30 mg via INTRAVENOUS

## 2022-10-26 MED ORDER — FENTANYL CITRATE (PF) 100 MCG/2ML IJ SOLN
INTRAMUSCULAR | Status: DC | PRN
  Administered 2022-10-26: 100 ug via INTRAVENOUS

## 2022-10-26 MED ORDER — DEXAMETHASONE SODIUM PHOSPHATE 10 MG/ML IJ SOLN
INTRAMUSCULAR | Status: AC
  Filled 2022-10-26: qty 1

## 2022-10-26 MED ORDER — IBUPROFEN 600 MG PO TABS: 600.0000 mg | ORAL_TABLET | Freq: Four times a day (QID) | ORAL | 1 refills | Status: DC

## 2022-10-26 MED ORDER — CHLORHEXIDINE GLUCONATE CLOTH 2 % EX PADS: 6.0000 | MEDICATED_PAD | Freq: Once | CUTANEOUS | Status: DC

## 2022-10-26 MED ORDER — AMISULPRIDE (ANTIEMETIC) 5 MG/2ML IV SOLN
10.0000 mg | Freq: Once | INTRAVENOUS | Status: AC
  Administered 2022-10-26: 10 mg via INTRAVENOUS

## 2022-10-26 MED ORDER — BUPIVACAINE LIPOSOME 1.3 % IJ SUSP: 20.0000 mL | Freq: Once | INTRAMUSCULAR | Status: DC

## 2022-10-26 MED ORDER — SUGAMMADEX SODIUM 200 MG/2ML IV SOLN
INTRAVENOUS | Status: DC | PRN
  Administered 2022-10-26: 400 mg via INTRAVENOUS

## 2022-10-26 MED ORDER — PROPOFOL 10 MG/ML IV BOLUS
INTRAVENOUS | Status: DC | PRN
  Administered 2022-10-26: 200 mg via INTRAVENOUS

## 2022-10-26 MED ORDER — PROMETHAZINE HCL 25 MG/ML IJ SOLN: 6.2500 mg | INTRAMUSCULAR | Status: DC | PRN

## 2022-10-26 MED ORDER — LIDOCAINE HCL (PF) 1 % IJ SOLN
INTRAMUSCULAR | Status: AC
  Filled 2022-10-26: qty 30

## 2022-10-26 MED ORDER — PROPOFOL 10 MG/ML IV BOLUS
INTRAVENOUS | Status: AC
  Filled 2022-10-26: qty 20

## 2022-10-26 MED ORDER — BUPIVACAINE LIPOSOME 1.3 % IJ SUSP
INTRAMUSCULAR | Status: AC
  Filled 2022-10-26: qty 20

## 2022-10-26 MED ORDER — ROCURONIUM BROMIDE 10 MG/ML (PF) SYRINGE
PREFILLED_SYRINGE | INTRAVENOUS | Status: AC
  Filled 2022-10-26: qty 10

## 2022-10-26 MED ORDER — LACTATED RINGERS IV SOLN: INTRAVENOUS | Status: DC

## 2022-10-26 MED ORDER — ACETAMINOPHEN 500 MG PO TABS: 1000.0000 mg | ORAL_TABLET | Freq: Four times a day (QID) | ORAL | 3 refills | Status: DC

## 2022-10-26 MED ORDER — BUPIVACAINE HCL 0.25 % IJ SOLN
INTRAMUSCULAR | Status: AC
  Filled 2022-10-26: qty 1

## 2022-10-26 MED ORDER — OXYCODONE HCL 5 MG PO TABS: 5.0000 mg | ORAL_TABLET | ORAL | 0 refills | Status: AC | PRN

## 2022-10-26 MED ORDER — CEFAZOLIN SODIUM 1 G IJ SOLR
INTRAMUSCULAR | Status: AC
  Filled 2022-10-26: qty 10

## 2022-10-26 MED ORDER — CEFAZOLIN IN SODIUM CHLORIDE 3-0.9 GM/100ML-% IV SOLN: 3.0000 g | INTRAVENOUS | Status: DC

## 2022-10-26 MED ORDER — BUPIVACAINE LIPOSOME 1.3 % IJ SUSP
INTRAMUSCULAR | Status: DC | PRN
  Administered 2022-10-26: 20 mL

## 2022-10-26 MED ORDER — BUPIVACAINE-EPINEPHRINE (PF) 0.25% -1:200000 IJ SOLN
INTRAMUSCULAR | Status: AC
  Filled 2022-10-26: qty 30

## 2022-10-26 MED ORDER — MIDAZOLAM HCL 5 MG/5ML IJ SOLN
INTRAMUSCULAR | Status: DC | PRN
  Administered 2022-10-26: 2 mg via INTRAVENOUS

## 2022-10-26 MED ORDER — OXYCODONE HCL 5 MG PO TABS
ORAL_TABLET | ORAL | Status: AC
  Administered 2022-10-26: 5 mg via ORAL
  Filled 2022-10-26: qty 1

## 2022-10-26 MED ORDER — ACETAMINOPHEN 500 MG PO TABS
1000.0000 mg | ORAL_TABLET | ORAL | Status: AC
  Administered 2022-10-26: 1000 mg via ORAL
  Filled 2022-10-26: qty 2

## 2022-10-26 MED ORDER — METHOCARBAMOL 750 MG PO TABS: 750.0000 mg | ORAL_TABLET | Freq: Four times a day (QID) | ORAL | 1 refills | Status: DC

## 2022-10-26 MED ORDER — HEPARIN SODIUM (PORCINE) 5000 UNIT/ML IJ SOLN
5000.0000 [IU] | Freq: Once | INTRAMUSCULAR | Status: AC
  Administered 2022-10-26: 5000 [IU] via SUBCUTANEOUS
  Filled 2022-10-26: qty 1

## 2022-10-26 MED ORDER — LIDOCAINE HCL (PF) 2 % IJ SOLN
INTRAMUSCULAR | Status: AC
  Filled 2022-10-26: qty 5

## 2022-10-26 MED ORDER — ORAL CARE MOUTH RINSE: 15.0000 mL | Freq: Once | OROMUCOSAL | Status: AC

## 2022-10-26 MED ORDER — FENTANYL CITRATE (PF) 100 MCG/2ML IJ SOLN
INTRAMUSCULAR | Status: AC
  Filled 2022-10-26: qty 2

## 2022-10-26 MED ORDER — OXYCODONE HCL 5 MG PO TABS: 5.0000 mg | ORAL_TABLET | Freq: Once | ORAL | Status: AC

## 2022-10-26 MED ORDER — FENTANYL CITRATE PF 50 MCG/ML IJ SOSY
25.0000 ug | PREFILLED_SYRINGE | INTRAMUSCULAR | Status: DC | PRN
  Administered 2022-10-26: 50 ug via INTRAVENOUS

## 2022-10-26 MED ORDER — CEFAZOLIN IN SODIUM CHLORIDE 3-0.9 GM/100ML-% IV SOLN
3.0000 g | INTRAVENOUS | Status: AC
  Administered 2022-10-26: 3 g via INTRAVENOUS
  Filled 2022-10-26: qty 100

## 2022-10-26 MED ORDER — ROCURONIUM BROMIDE 100 MG/10ML IV SOLN
INTRAVENOUS | Status: DC | PRN
  Administered 2022-10-26: 70 mg via INTRAVENOUS

## 2022-10-26 MED ORDER — LIDOCAINE HCL (CARDIAC) PF 100 MG/5ML IV SOSY
PREFILLED_SYRINGE | INTRAVENOUS | Status: DC | PRN
  Administered 2022-10-26: 100 mg via INTRAVENOUS

## 2022-10-26 MED ORDER — ONDANSETRON HCL 4 MG/2ML IJ SOLN
INTRAMUSCULAR | Status: DC | PRN
  Administered 2022-10-26: 4 mg via INTRAVENOUS

## 2022-10-26 MED ORDER — MIDAZOLAM HCL 2 MG/2ML IJ SOLN
INTRAMUSCULAR | Status: AC
  Filled 2022-10-26: qty 2

## 2022-10-26 MED ORDER — BUPIVACAINE-EPINEPHRINE 0.25% -1:200000 IJ SOLN
INTRAMUSCULAR | Status: DC | PRN
  Administered 2022-10-26: 20 mL

## 2022-10-26 MED ORDER — PHENYLEPHRINE 80 MCG/ML (10ML) SYRINGE FOR IV PUSH (FOR BLOOD PRESSURE SUPPORT)
PREFILLED_SYRINGE | INTRAVENOUS | Status: DC | PRN
  Administered 2022-10-26 (×2): 160 ug via INTRAVENOUS

## 2022-10-26 SURGICAL SUPPLY — 47 items
ADH SKN CLS APL DERMABOND .7 (GAUZE/BANDAGES/DRESSINGS) ×1
APL PRP STRL LF DISP 70% ISPRP (MISCELLANEOUS) ×1
APPLIER CLIP 5 13 M/L LIGAMAX5 (MISCELLANEOUS) ×1
APPLIER CLIP ROT 10 11.4 M/L (STAPLE)
APR CLP MED LRG 11.4X10 (STAPLE)
APR CLP MED LRG 5 ANG JAW (MISCELLANEOUS) ×1
BAG SPEC RTRVL 10 TROC 200 (ENDOMECHANICALS) ×1
BLADE CLIPPER SURG (BLADE) IMPLANT
CANISTER SUCT 3000ML PPV (MISCELLANEOUS) ×1 IMPLANT
CHLORAPREP W/TINT 26 (MISCELLANEOUS) ×1 IMPLANT
CLIP APPLIE 5 13 M/L LIGAMAX5 (MISCELLANEOUS) ×1 IMPLANT
CLIP APPLIE ROT 10 11.4 M/L (STAPLE) ×1 IMPLANT
COVER MAYO STAND XLG (MISCELLANEOUS) ×1 IMPLANT
COVER SURGICAL LIGHT HANDLE (MISCELLANEOUS) ×1 IMPLANT
DERMABOND ADVANCED .7 DNX12 (GAUZE/BANDAGES/DRESSINGS) ×1 IMPLANT
DISSECTOR BLUNT TIP ENDO 5MM (MISCELLANEOUS) ×1 IMPLANT
DRAPE C-ARM 42X120 X-RAY (DRAPES) IMPLANT
ELECT L-HOOK LAP 45CM DISP (ELECTROSURGICAL) ×1
ELECT PENCIL ROCKER SW 15FT (MISCELLANEOUS) ×1 IMPLANT
ELECTRODE L-HOOK LAP 45CM DISP (ELECTROSURGICAL) IMPLANT
ENDOLOOP SUT PDS II  0 18 (SUTURE)
ENDOLOOP SUT PDS II 0 18 (SUTURE) IMPLANT
GLOVE BIO SURGEON STRL SZ 6.5 (GLOVE) ×1 IMPLANT
GLOVE BIOGEL PI IND STRL 6 (GLOVE) ×1 IMPLANT
GOWN STRL REUS W/ TWL LRG LVL3 (GOWN DISPOSABLE) ×3 IMPLANT
GOWN STRL REUS W/TWL LRG LVL3 (GOWN DISPOSABLE) ×3
IRRIG SUCT STRYKERFLOW 2 WTIP (MISCELLANEOUS)
IRRIGATION SUCT STRKRFLW 2 WTP (MISCELLANEOUS) IMPLANT
KIT BASIN OR (CUSTOM PROCEDURE TRAY) ×1 IMPLANT
NDL INSUFFLATION 14GA 120MM (NEEDLE) IMPLANT
NEEDLE INSUFFLATION 14GA 120MM (NEEDLE) ×1 IMPLANT
NS IRRIG 1000ML POUR BTL (IV SOLUTION) ×1 IMPLANT
PAD ARMBOARD 7.5X6 YLW CONV (MISCELLANEOUS) ×1 IMPLANT
POUCH RETRIEVAL ECOSAC 10 (ENDOMECHANICALS) IMPLANT
POUCH RETRIEVAL ECOSAC 10MM (ENDOMECHANICALS) ×1
SCISSORS LAP 5X35 DISP (ENDOMECHANICALS) ×1 IMPLANT
SET CHOLANGIOGRAPH MIX (MISCELLANEOUS) IMPLANT
SET TUBE SMOKE EVAC HIGH FLOW (TUBING) ×1 IMPLANT
SLEEVE ADV FIXATION 5X100MM (TROCAR) ×2 IMPLANT
SUT MNCRL AB 4-0 PS2 18 (SUTURE) ×1 IMPLANT
SUT VICRYL 0 UR6 27IN ABS (SUTURE) IMPLANT
SYS BAG RETRIEVAL 10MM (BASKET)
SYSTEM BAG RETRIEVAL 10MM (BASKET) IMPLANT
TRAY LAPAROSCOPIC (CUSTOM PROCEDURE TRAY) ×1 IMPLANT
TROCAR BALLN 12MMX100 BLUNT (TROCAR) ×1 IMPLANT
TROCAR Z-THREAD FIOS 5X100MM (TROCAR) ×1 IMPLANT
WATER STERILE IRR 1000ML POUR (IV SOLUTION) ×1 IMPLANT

## 2022-10-26 NOTE — Discharge Instructions (Addendum)
[facility]  LAPAROSCOPIC SURGERY: POST OP INSTRUCTIONS Always review your discharge instruction sheet given to you by the facility where your surgery was performed. IF YOU HAVE DISABILITY OR FAMILY LEAVE FORMS, YOU MUST BRING THEM TO THE OFFICE FOR PROCESSING.   DO NOT GIVE THEM TO YOUR DOCTOR.  PAIN CONTROL  Pain regimen: take over-the-counter tylenol (acetaminophen) 1000mg  every six hours, the prescription ibuprofen (600mg ) every six hours and the robaxin (methocarbamol) 750mg  every six hours. With all three of these, you should be taking something every two hours. Example: tylenol ( acetaminophen) at 8am, ibuprofen at 10am, robaxin (methocarbamol) at 12pm, tylenol (acetaminophen) again at 2pm, ibuprofen again at 4pm, robaxin (methocarbamol) at 6pm. You also have a prescription for oxycodone, which should be taken if the tylenol (acetaminophen), ibuprofen, and robaxin (methocarbamol) are not enough to control your pain. You may take the oxycodone as frequently as every four hours as needed, but if you are taking the other medications as above, you should not need the oxycodone this frequently. You have also been given a prescription for colace (docusate) which is a stool softener. Please take this as prescribed because the oxycodone can cause constipation and the colace (docusate) will minimize or prevent constipation. Do not drive while taking or under the influence of the oxycodone as it is a narcotic medication. Use ice packs to help control pain. If you need a refill on your pain medication, please contact your pharmacy.  They will contact our office to request authorization. Prescriptions will not be filled after 5pm or on week-ends.  HOME MEDICATIONS Take your usually prescribed medications unless otherwise directed.  DIET You should follow a light diet the first few days after arrival home.  Be sure to include lots of fluids daily.   CONSTIPATION It is common to  experience some constipation after surgery and if you are taking pain medication.  Increasing fluid intake and taking a stool softener (such as Colace) will usually help or prevent this problem from occurring.  A mild laxative (Milk of Magnesia or Miralax) should be taken according to package instructions if there are no bowel movements after 48 hours.  WOUND/INCISION CARE Most patients will experience some swelling and bruising in the area of the incisions.  Ice packs will help.  Swelling and bruising can take several days to resolve.  May shower beginning 10/27/2022.  Do not peel off or scrub skin glue. May allow warm soapy water to run over incision, then rinse and pat dry.  Do not soak in any water (tubs, hot tubs, pools, lakes, oceans) for one week.   ACTIVITIES You may resume regular (light) daily activities beginning the next day--such as daily self-care, walking, climbing stairs--gradually increasing activities as tolerated.  You may have sexual intercourse when it is comfortable.   No lifting greater than 5 pounds for six weeks.  You may drive when you are no longer taking narcotic pain medication, you can comfortably wear a seatbelt, and you can safely maneuver your car and apply brakes.  FOLLOW-UP You should see your doctor in the office for a follow-up appointment approximately 2-3 weeks after your surgery.  You should have been given your post-op/follow-up appointment when your surgery was scheduled.  If you did not receive a post-op/follow-up appointment, make sure that you call for this appointment within a day or two after you arrive home to insure a convenient appointment time.  WHEN TO CALL YOUR DOCTOR: Fever over 101.5 Inability to urinate  Continued bleeding from incision. Increased pain, redness, or drainage from the incision. Increasing abdominal pain  The clinic staff is available to answer your questions during regular business hours.  Please don't hesitate to call and  ask to speak to one of the nurses for clinical concerns.  If you have a medical emergency, go to the nearest emergency room or call 911.  A surgeon from [facility] is always on call at the hospital. [address] [place], [place], [place]  [address] ? [address], [place], [place]   [address] [phone] ? [phone] ? FAX [phone] Web site: [email or web address]

## 2022-10-26 NOTE — H&P (Signed)
    Colleen Garrett is an 37 y.o. female.   HPI: 51F with symptomatic cholelithiasis. The patient has had no hospitalizations, ER visits, surgeries, or newly diagnosed allergies since being seen in the office. Saw a nutritionist and PCP for renal/thyroid function, was started on spironolactone.    Past Medical History:  Diagnosis Date   Anemia    Anxiety    Chlamydia    Depression    Hypertension    Obesity    UTI (lower urinary tract infection)    proteus UTI    Past Surgical History:  Procedure Laterality Date   NO PAST SURGERIES      Family History  Problem Relation Age of Onset   Heart attack Mother    Hypertension Mother    Heart attack Father    Hypertension Father    Hyperlipidemia Father    Stroke Sister    Obesity Sister    Hyperlipidemia Brother    Skin cancer Brother    Coronary artery disease Brother     Social History:  reports that she has never smoked. She has never used smokeless tobacco. She reports current drug use. Drug: Marijuana. She reports that she does not drink alcohol.  Allergies:  Allergies  Allergen Reactions   Doxycycline Nausea Only and Other (See Comments)    Nausea and dizziness    Medications: I have reviewed the patient's current medications.  Results for orders placed or performed during the hospital encounter of 10/26/22 (from the past 48 hour(s))  Pregnancy, urine POC     Status: None   Collection Time: 10/26/22  6:00 AM  Result Value Ref Range   Preg Test, Ur NEGATIVE NEGATIVE    Comment:        THE SENSITIVITY OF THIS METHODOLOGY IS >24 mIU/mL     No results found.  ROS 10 point review of systems is negative except as listed above in HPI.   Physical Exam Blood pressure (!) 158/100, pulse 81, temperature 98.8 F (37.1 C), temperature source Oral, resp. rate (!) 21, height 5\' 6"  (1.676 m), weight (!) 155.1 kg, last menstrual period 10/05/2022, SpO2 98 %. Constitutional: well-developed, well-nourished HEENT:  pupils equal, round, reactive to light, 51mm b/l, moist conjunctiva, external inspection of ears and nose normal, hearing intact Oropharynx: normal oropharyngeal mucosa, normal dentition Neck: no thyromegaly, trachea midline, no midline cervical tenderness to palpation Chest: breath sounds equal bilaterally, normal respiratory effort, no midline or lateral chest wall tenderness to palpation/deformity Abdomen: soft, NT, no bruising, no hepatosplenomegaly GU: normal female genitalia  Back: no wounds, no thoracic/lumbar spine tenderness to palpation, no thoracic/lumbar spine stepoffs Extremities: 2+ radial and pedal pulses bilaterally, intact motor and sensation bilateral UE and LE, no peripheral edema MSK: normal gait/station, no clubbing/cyanosis of fingers/toes, normal ROM of all four extremities Skin: warm, dry, no rashes Psych: normal memory, normal mood/affect     Assessment/Plan: 51F with symptomatic cholelithiasis. Plan for lap chole. Informed consent was obtained after detailed explanation of risks, including bleeding, infection, biloma, hematoma, injury to common bile duct, need for IOC to delineate anatomy, and need for conversion to open procedure. All questions answered to the patient's satisfaction.   Jesusita Oka, MD General and [facility]

## 2022-10-26 NOTE — Transfer of Care (Signed)
Immediate Anesthesia Transfer of Care Note  Patient: Colleen Garrett  Procedure(s) Performed: LAPAROSCOPIC CHOLECYSTECTOMY (Abdomen)  Patient Location: PACU  Anesthesia Type:General  Level of Consciousness: awake, alert , and oriented  Airway & Oxygen Therapy: Patient Spontanous Breathing and Patient connected to face mask oxygen  Post-op Assessment: Report given to RN and Post -op Vital signs reviewed and stable  Post vital signs: Reviewed and stable  Last Vitals:  Vitals Value Taken Time  BP 157/103 10/26/22 0903  Temp    Pulse 78 10/26/22 0906  Resp 18 10/26/22 0906  SpO2 100 % 10/26/22 0906  Vitals shown include unvalidated device data.  Last Pain:  Vitals:   10/26/22 0555  TempSrc: Oral         Complications: No notable events documented.

## 2022-10-26 NOTE — Op Note (Signed)
   Operative Note  Date: 10/26/2022  Procedure: laparoscopic cholecystectomy  Pre-op diagnosis: symptomatic cholelithiasis Post-op diagnosis: same  Indication and clinical history: The patient is a 37 y.o. year old female with symptomatic cholelithiasis  Surgeon: Jesusita Oka, MD Assistant: Harlow Asa, MD  Anesthesiologist: Gifford Shave, MD Anesthesia: General  Findings:  Specimen: gallbladder EBL: <5cc Drains/Implants: none  Disposition: PACU - hemodynamically stable.  Description of procedure: The patient was positioned supine on the operating room table. Time-out was performed verifying correct patient, procedure, signature of informed consent, and administration of pre-operative antibiotics. General anesthetic induction and intubation were uneventful. The abdomen was prepped and draped in the usual sterile fashion. An infra-umbilical incision was made using an open technique using zero vicryl stay sutures on either side of the fascia and a 26mm Hassan port inserted. After establishing pneumoperitoneum, which the patient tolerated well, the abdominal cavity was inspected and no injury of any intra-abdominal structures was identified. Additional ports were placed under direct visualization and using local anesthetic: two 12mm ports in the right subcostal region and a 4mm port in the epigastric region. The patient was re-positioned to reverse Trendelenburg and right side up. Adhesiolysis was performed to expose the gallbladder, which was then retracted cephalad. The infundibulum was identified and retracted toward the right lower quadrant. The peritoneum was incised over the infundibulum and the triangle of Calot dissected to expose the critical view of safety. With clear identification and isolation of the cystic duct and cystic artery, the cystic artery was doubly clipped and divided. After this, the cystic duct was identified as a single structure entering the gallbladder, and was also doubly  clipped and divided. The gallbladder was dissected off the liver bed using electrocautery and hemostasis of the liver bed was confirmed prior to separation of the final peritoneal attachments of the gallbladder to the liver bed. The gallbladder fossa was irrigated and fluid returned clear. After transection of the final peritoneal attachments, the gallbladder was placed in an endoscopic specimen retrieval bag, removed via the umbilical port site, and sent to pathology as a permanent specimen. The gallbladder fossa was inspected confirming hemostasis, the absence of bile leakage from the cystic duct stump, and correct placement of clips on the cystic artery and cystic duct stumps. The abdomen was desufflated and the fascia of the umbilical port site was closed using the previously placed stay sutures. Additional local anesthetic was administered at the umbilical port site.  The skin of all incisions was closed with 4-0 monocryl. Sterile dressings were applied. All sponge and instrument counts were correct at the conclusion of the procedure. The patient was awakened from anesthesia, extubated uneventfully, and transported to the PACU - hemodynamically stable.. There were no complications.    Jesusita Oka, MD General and [facility]

## 2022-10-26 NOTE — Anesthesia Procedure Notes (Signed)
Procedure Name: Intubation Date/Time: 10/26/2022 7:41 AM  Performed by: [place] Manus Rudd, CRNAPre-anesthesia Checklist: Patient identified, Emergency Drugs available, Suction available and Patient being monitored Patient Re-evaluated:Patient Re-evaluated prior to induction Oxygen Delivery Method: Circle system utilized Preoxygenation: Pre-oxygenation with 100% oxygen Induction Type: IV induction Ventilation: Mask ventilation without difficulty Laryngoscope Size: Mac and 3 Grade View: Grade I Tube type: Oral Tube size: 7.5 mm Number of attempts: 1 Airway Equipment and Method: Stylet and Oral airway Placement Confirmation: ETT inserted through vocal cords under direct vision, positive ETCO2 and breath sounds checked- equal and bilateral Secured at: 23 cm Tube secured with: Tape Dental Injury: Teeth and Oropharynx as per pre-operative assessment

## 2022-10-27 ENCOUNTER — Encounter (HOSPITAL_COMMUNITY): Payer: Self-pay | Admitting: Surgery

## 2022-10-27 LAB — SURGICAL PATHOLOGY

## 2022-10-27 NOTE — Anesthesia Postprocedure Evaluation (Signed)
Anesthesia Post Note  Patient: Colleen Garrett  Procedure(s) Performed: LAPAROSCOPIC CHOLECYSTECTOMY (Abdomen)     Patient location during evaluation: PACU Anesthesia Type: General Level of consciousness: awake and alert Pain management: pain level controlled Vital Signs Assessment: post-procedure vital signs reviewed and stable Respiratory status: spontaneous breathing, nonlabored ventilation, respiratory function stable and patient connected to nasal cannula oxygen Cardiovascular status: blood pressure returned to baseline and stable Postop Assessment: no apparent nausea or vomiting Anesthetic complications: no   No notable events documented.  Last Vitals:  Vitals:   10/26/22 1002 10/26/22 1015  BP:  (!) 166/86  Pulse: 80 76  Resp: (!) 22 20  Temp:  36.4 C  SpO2: 95% 100%    Last Pain:  Vitals:   10/26/22 1015  TempSrc:   PainSc: 3    Pain Goal: Patients Stated Pain Goal: 5 (10/26/22 1002)                 Santa Lighter

## 2022-10-30 ENCOUNTER — Ambulatory Visit: Payer: No Typology Code available for payment source

## 2022-10-30 DIAGNOSIS — Z0000 Encounter for general adult medical examination without abnormal findings: Secondary | ICD-10-CM

## 2022-10-30 NOTE — Progress Notes (Unsigned)
Appointment Outcome: Completed, Session #: 5                         Start time: 10:01am   End time: 10:32am   Total Mins: 31 minutes  AGREEMENTS SECTION   Overall Goal(s): Improve healthy eating habits   Stress management   Agreement/Action Steps:  Reduce Sodium Intake Continue to use Mrs. Dash/Salt free Seasonings Read food labels Practice portion control   Make healthier food choices Meal plan/prep for 2-3 days at a time Choose healthier snacks with less sugar   Self-care Engage in physical activity 2-3xs/week for a minimum of 15 minutes. Walk dogs Walk to mailbox and/or trash disposal Use weights at apartment's exercise center daily Write in journal a minimum of twice weekly.   Progress Notes:  Patient reported that she recently had surgery and have been trying to follow the nutrition instructions that she was given. Patient stated that she has continued to meal prep to ensure that she has healthy food to eat. Patient mentioned that she recently prepared a chicken, broccoli, and carrot soup with sodium free chicken stock that she mixed with water to ensure that she is monitoring her sodium intake. Patient stated that she is practicing portion control by reading the food labels and measuring out serving sizes. Patient stated that she is understanding how to read food labels more and can determine the nutritional breakdown in a serving compared to the entire contents of the package.  Patient stated that she has had challenges with eating sugary snacks because her family has given her Reese candy and she has been eating it. Patient stated that she talks to herself to minimize the amount of sugar she consumes by allowing herself a limited about of chocolate per day. Patient shared that she has found that eating a Fiber One brownie helps satisfy her craving for something sweet. Patient mentioned that finding such snacks would be healthier for her to choose and she will be working on  making that adjustment.  Patient shared that the week prior to her surgery she kept dogs and walked them daily. Patient reported that she has been able to push past walking 15 minutes and the pain in her legs that she would feel to walking to the dog park that takes 20 minutes. Patient shared that she noticed that walking past 15 minutes that the pain begins to subside. Patient expressed that she is motivated to increase the amount of time that she spends walking. Patient wants to walk for about 1-hour before she begins lifting weights. Patient is planning on getting ankle weights to do leg toning exercises.  Patient mentioned that prior to her surgery she had not use weights at her exercise center. Patient stated that she decided to focus on walking to increase her stamina. Patient stated that she has been walking around in her home and completing light household chores to remain active. Patient stated that she must hold on to things and walk slow to avoid falling. Patient stated that since returning home and being unstable in her walking, she has not gone downstairs to check the mailbox.  Patient stated that she has written in her journal and is surprised to learn how helpful it would be in managing her stress. Patient stated that being able to write and reflect on the positive aspects of her day, helps her to remain optimistic. Patient currently expresses her mood as extremely happy and rated her stress level  1/10. Patient stated that she has not been focused on future things to come but rather deal with things as they come.     Indicators of Success and Accountability:  Patient stated that her ability to keep up with exercising by walking is her indicator of success and accountability due to increasing her distance and time spent walking daily for 20 minutes one way to the dog park.  Readiness: Patient is in the action stage of improving healthy eating habits and stress management.  Strengths and  Supports: Patient is being supported by family. Patient is relying on her determination and ability to be consistent.  Challenges and Barriers: Patient is unable to engage in strenuous physical activity for 6 weeks.   Coaching Outcomes: Patient is modifying her action steps to implement self-care for stress management. Patient will continue to write in her journal as stated above. However, patient will limit her physical activity to walking in her home and doing light household chores until she is cleared after 6 weeks of recovery.  Patient will continue to implement the remaining action steps as outlined above.    Attempted: Fulfilled - Patient was able to maintain her action steps to improve healthy eating habits by reducing sodium consumption and making healthier food choices over the past two weeks. Patient has been writing in her journal to practice self-care.  Partial - Patient was able to maintain engagement in physical activity as planned for one week prior to surgery.

## 2022-11-13 ENCOUNTER — Ambulatory Visit: Payer: No Typology Code available for payment source

## 2022-11-13 ENCOUNTER — Telehealth: Payer: Self-pay

## 2022-11-13 DIAGNOSIS — Z0000 Encounter for general adult medical examination without abnormal findings: Secondary | ICD-10-CM

## 2022-11-13 NOTE — Telephone Encounter (Signed)
Called patient for health coaching session. Left patient a message to return call to hold session or to reschedule.   [name], MS, ERHD, [facility]  Care Guide, Health & Wellness Coach [address] [place] [address] Telephone: [phone] Email: [email or web address]

## 2022-11-16 NOTE — Progress Notes (Signed)
Communicated with patient via [employer] messaging around 8:30am regarding reschedule her health coaching appointment. Patient is interested in having her telephonic health coaching session tomorrow at 10:00am. Patient has been rescheduled accordingly.

## 2022-11-17 ENCOUNTER — Ambulatory Visit: Payer: No Typology Code available for payment source

## 2022-11-17 DIAGNOSIS — Z0000 Encounter for general adult medical examination without abnormal findings: Secondary | ICD-10-CM

## 2022-11-17 NOTE — Progress Notes (Signed)
Appointment Outcome: Completed, Session #: 6                         Start time: 10:03am   End time: 10:34am   Total Mins: 31 minutes  AGREEMENTS SECTION   Overall Goal(s): Improve healthy eating habits   Stress management   Agreement/Action Steps:  Reduce Sodium Intake Continue to use Mrs. Dash/Salt free Seasonings Read food labels Practice portion control   Make healthier food choices Meal plan/prep for 2-3 days at a time Choose healthier snacks with less sugar   Self-care Engage in physical activity 2-3xs/week for a minimum of 15 minutes. Walk dogs Walk to mailbox and/or trash disposal Use weights at apartment's exercise center daily Write in journal a minimum of twice weekly.    Progress Notes:  Patient reported that her stress level is 3/10 but expressed it would be lower if she were able to handle some concerns such as carrying her cat to the veterinarian. Patient is looking for someone that is willing to help her because she is not able to lift anything heavier than 5 pounds. Patient stated that concerns such as these occupy her mind, but they do not interfere with her ability to cope or function.   Patient shared that she has been engaging in light household chores that has enabled her to keep her living space clean. Patient stated that she was walking around her home but decided to go for a walk around her apartment complex, which took her about 30 minutes to complete. Patient mentioned that she purchased 5-pound ankle weights that she wears when she is cleaning her home and walking around.   Patient also have 5-pound toning weights that she uses while sitting and walking around when cooking. Patient stated that each time she uses them throughout the day, she does 10 reps per arm. Patient stated that over the remaining next two months, she is not allowed to lift more than 5 pounds. Patient shared that after being cleared by her doctor she will work on increasing her walking  distance and weightlifting. Patient stated that at this time, she will start walking her trash to the dumpster.   Patient continued to meal planning/prepping over the past two weeks. Patient gave an example of how she was able to take a [employer]ou[employer] vegetable lasagna and added extra unseasoned vegetables to stretch the dish. Patient controlled her sodium intake by sectioning the lasagna according to the number of recommended servings and stored them. Patient was able to monitor her sodium intake easier with this method.   Patient stated that she cut down on snacking, by limiting having snacks in her home. Patient also indicated that she eats until she is satisfied and drinks more water to refrain from snacking. Patient stated that she also hid certain foods from herself to reduce the frequency of eating them. Patient also stated that when she ate Chipotle with her brother, she did not eat the entire bowl and was able to put majority of it away but did not finish it.   Patient stated that she recently had to purchase a new journal. Patient has expanded what she uses her self-care journal for, which includes taking inspirational notes, writing down her thoughts, and tracking her food consumption. Patient shared that tracking her food has been helpful in keeping her accountable with her eating habits. Patient stated that she feels good about her progress she's made over the past three months but  was hoping to lose more than 10 pounds. Patient stated that she has seen improvements in her blood pressure, which was the focus.    Indicators of Success and Accountability:  Patient stated that being able to eat healthy is her indicator of success because she had challenges eating after her surgery.  Readiness: Patient is in the action stage of stress management and improving healthy eating behaviors.   Strengths and Supports: Patient is being supported by her family and friends. Patient is relying on an increase  in self-awareness and patience to make these behavior changes.   Challenges and Barriers: Patient does not foresee any challenges to implementing her action steps over the next month.    Coaching Outcomes: Patient will continue to implement her action steps as outlined above over the next month in addition to the following below:  Self-care Engage in physical activity 2-3xs/week for a minimum of 15-30 minutes. Walk to mailbox and/or trash disposal Walk around apartment complex in the morning Use 5-pound ankle and toning weights when walking Write in journal a minimum of twice weekly.   Attempted: Fulfilled - patient completed the bi-weekly agreement in full and was able to meet the challenge.

## 2022-11-18 ENCOUNTER — Ambulatory Visit (HOSPITAL_BASED_OUTPATIENT_CLINIC_OR_DEPARTMENT_OTHER): Payer: No Typology Code available for payment source | Admitting: Cardiovascular Disease

## 2022-12-03 ENCOUNTER — Ambulatory Visit: Payer: No Typology Code available for payment source | Admitting: Family Medicine

## 2022-12-03 ENCOUNTER — Encounter: Payer: Self-pay | Admitting: Family Medicine

## 2022-12-03 VITALS — BP 128/84 | HR 84 | Temp 98.8°F | Ht 67.0 in | Wt 353.4 lb

## 2022-12-03 DIAGNOSIS — I10 Essential (primary) hypertension: Secondary | ICD-10-CM | POA: Insufficient documentation

## 2022-12-03 DIAGNOSIS — F419 Anxiety disorder, unspecified: Secondary | ICD-10-CM | POA: Diagnosis not present

## 2022-12-03 DIAGNOSIS — Z7689 Persons encountering health services in other specified circumstances: Secondary | ICD-10-CM

## 2022-12-03 DIAGNOSIS — F324 Major depressive disorder, single episode, in partial remission: Secondary | ICD-10-CM

## 2022-12-03 DIAGNOSIS — Z9049 Acquired absence of other specified parts of digestive tract: Secondary | ICD-10-CM

## 2022-12-03 MED ORDER — AMLODIPINE-OLMESARTAN 10-40 MG PO TABS: 1.0000 | ORAL_TABLET | Freq: Every day | ORAL | 3 refills | Status: DC

## 2022-12-03 NOTE — Progress Notes (Signed)
Established Patient Office Visit   Subjective  Patient ID: Colleen Garrett, female    DOB: [birth year]  Age: 37 y.o. MRN: [id number]  Chief Complaint  Patient presents with   Establish Care    Needed someone closer    Patient is a 37 year old female with pmh sig for HTN, obesity, anxiety, depression who was previously seen by Drue Dun, NP in [facility].  Patient presents to establish care and follow-up on ongoing concerns.  HTN: Taking Norvasc-olmesartan 10-40 mg daily and spironolactone.  Checking BP at home.  Typically 120s/80s.  Patient mindful of sodium intake.  Seen by Cardiology, Gillian Shields, NP.   Obesity: Previously on Saxenda however developed nausea.  Nausea later found to be a/w gallstones.  Reginal Lutes was approved by insurance but pt can't start it until 3 months after cholecystectomy.  Had cholecystectomy 10/26/2022.  Has been seen at weight management.  Anxiety/depression: Taking Lexapro and BuSpar.  Pt in faith-based counseling.  States she feels like she is in a better place and is interested in stopping medication.  Symptoms initially occurred during the pandemic when pt was working from [employer].  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   Allergies: Doxycycline-dizziness, nausea, vomiting Sulfa drugs-dizziness, nausea, vomiting  Health maintenance: -Never had a Pap  Family medical history: Mom-deceased, MI Sister-CVA at age 81 Brother-several heart surgeries including valve replacement, pacemaker placement     ROS Negative unless stated above    Objective:     BP 128/84 (BP Location: Right Arm, Patient Position: Sitting, Cuff Size: Large)   Pulse 84   Temp 98.8 F (37.1 C) (Oral)   Ht  (1.702 m)   Wt (!) 353 lb 6.4 oz (160.3 kg)   LMP 11/26/2022   SpO2 97%   BMI 55.35 kg/m    Physical Exam Constitutional:      General: She is not in acute  distress.    Appearance: Normal appearance.  HENT:     Head: Normocephalic and atraumatic.     Nose: Nose normal.     Mouth/Throat:     Mouth: Mucous membranes are moist.  Eyes:     Extraocular Movements: Extraocular movements intact.     Conjunctiva/sclera: Conjunctivae normal.     Pupils: Pupils are equal, round, and reactive to light.  Cardiovascular:     Rate and Rhythm: Normal rate and regular rhythm.     Heart sounds: Normal heart sounds. No murmur heard.    No gallop.  Pulmonary:     Effort: Pulmonary effort is normal. No respiratory distress.     Breath sounds: Normal breath sounds. No wheezing, rhonchi or rales.  Skin:    General: Skin is warm and dry.  Neurological:     Mental Status: She is alert and oriented to person, place, and time.        12/03/2022    9:45 AM 04/20/2015    8:57 AM  Depression screen PHQ 2/9  Decreased Interest 0 0  Down, Depressed, Hopeless 0 0  PHQ - 2 Score 0 0  Altered sleeping 0   Tired, decreased energy 0   Change in appetite 0   Feeling bad or failure about yourself  0   Trouble concentrating 0   Moving slowly or fidgety/restless 0   Suicidal thoughts 0   PHQ-9 Score 0       12/03/2022    9:46  AM  GAD 7 : Generalized Anxiety Score  Nervous, Anxious, on Edge 0  Control/stop worrying 0  Worry too much - different things 0  Trouble relaxing 0  Restless 0  Easily annoyed or irritable 0  Afraid - awful might happen 0  Total GAD 7 Score 0     No results found for any visits on 12/03/22.    Assessment & Plan:  Essential hypertension -controlled -continue amlodipine-olmesartan 10-40 mg daily, spironolactone 25 mg daily. -     amLODIPine-Olmesartan; Take 1 tablet by mouth daily.  Dispense: 90 tablet; Refill: 3  Morbid obesity -Body mass index is 55.35 kg/m. -Discussed lifestyle modifications -Weight management.  Consider starting [facility] 01/26/2023 which would be 3 months after cholecystectomy  Anxiety -GAD-7 score 0  this visit -Continue counseling -Continue Lexapro 10 mg and BuSpar 10 mg.  Depression, major, single episode, in partial remission -PHQ-9 score 0 -Continue counseling -Continue Lexapro 10 mg and BuSpar 10 mg.  Consider weaning medication  Encounter to establish care -We reviewed the PMH, PSH, FH, SH, Meds and Allergies. -We provided refills for any medications we will prescribe as needed. -We addressed current concerns per orders and patient instructions. -We have asked for records for pertinent exams, studies, vaccines and notes from previous providers. -We have advised patient to follow up per instructions below.  S/p cholecystectomy   Return in about 2 months (around 02/02/2023).  For CPE  Deeann Saint, MD

## 2022-12-17 ENCOUNTER — Ambulatory Visit: Payer: No Typology Code available for payment source | Attending: Cardiology

## 2022-12-17 DIAGNOSIS — Z0000 Encounter for general adult medical examination without abnormal findings: Secondary | ICD-10-CM

## 2022-12-17 NOTE — Progress Notes (Signed)
Appointment Outcome: Completed, Session #: 49-month f/u                        Start time: 10:00am   End time: 10:35am  Total Mins: 35 minutes  AGREEMENTS SECTION  Overall Goal(s): Improve healthy eating habits   Stress management   Agreement/Action Steps:  Improve health eating behaviors Reduce Sodium Intake Continue to use Mrs. Dash/Salt free Seasonings Read food labels Practice portion control Meal plan/prep for 2-3 days at a time Choose healthier snacks with less sugar    Stress management by implementing self-care plan Engage in physical activity 2-3xs/week for a minimum of 15-30 minutes. Walk to mailbox and/or trash disposal Walk around apartment complex in the morning Use 5-pound ankle and toning weights when walking Write in journal a minimum of twice weekly.    Progress Notes:  Patient stated that she has been doing well with implementing her action steps over the past month. Patient shared that she has been meal prepping 6 egg muffins with peppers for breakfast that she enjoys after a walk to a tree where she sits at her apartment complex. Patient stated that she is currently eating soft foods like this and applesauce because of a tooth injury. Patient shared that she meal prep for two days at a time.  Patient reported that she prepared hot wings with seasonings such as cumin that she purchased from an [place] market. Patient stated that she didn't use salt to prepare the wings, and the only time she used salt is when she is preparing fish. Patient stated that she is getting used to the taste of fish and uses a small pinch of salt. Patient shared that she continues to use Mrs. DASH and other salt free seasonings.  Patient expressed that reading food labels has become easier. Patient mentioned that reading food labels helps her practice portion control by knowing what the serving size is. Patient stated that this has been helpful because she wanted to eat less and has been able  to refrain from binge eating. Patient gave an example of her previously eating a pint of ice in one sitting but replacing that with a fruit bar. Patient stated that this practice helps her be more aware of her eating behavior. Patient shared that if she is still hungry, she may have a snack such as carrots or applesauce, and drink flavored water instead of eating more of her prepared meals.   Patient stated that she is watching her sodium intake and now purchase fresh or frozen goods. Patient stated that she mindful of what she is adding to her food for seasoning. Patient stated that she has been finding snacks that contain less sugar such as the applesauce, fruit bars, carrots, and pretzel crisp with red pepper humus. Patient shared that she made some beet cookies that were low in sugar.  Patient rated her stress level at a zero. Patient stated that she is not stressed out all with the self-care routine she is implementing. Patient reported that she feels happy and is weaning herself off her anxiety medication per her new PCP. Patient shared that she is finding herself more motivated. Patient explained that to keep that motivation going she talk herself through steps to engage in certain behaviors such as walking.   Patient stated that she walks daily to increase her steps. Patient stated that she does not walk 15 minutes outside every day but have found herself walking to the gas  station 30 minutes both ways. Patient stated that she takes a 5-minute daily walk to the tree at her apartment complex. Patient shared that she wants to be able to increase her stamina more because she wants to walk long distances without breathing heavy.   Patient mentioned that she continues to walk to her mailbox throughout the week and stopped driving her trash to the disposal and walk it there now. Patient stated that she graduated to 10-pound ankle weights and has a 25-pound arm toning weight that she has been able to do 5  reps per arm. Patient is interested in increasing her reps over time and have been using YouTube videos to guide her arm exercises.   Patient shared that she is using her journal to track her behavior changes. Patient stated that she will write about her mornings under the tree and making new friends, and writing a list of foods that she has eaten per day. Patient mentioned that it makes her accountable for her behavior, look at how far she has come and what she can do. Patient stated that this helps her practice gratitude and serves as a reminder to celebrate the small wins.    Indicators of Success and Accountability:  Patient stated that not eating so much in one sitting by practicing portion control and reading food labels for serving sizes is her biggest indicator of success and accountability. Patient   Readiness: Patient is in the action stage of stress management and improving healthy eating habits.   Strengths and Supports: Patient is being supported by family. Patient is relying on her willpower, increase in self-awareness, and self-motivation.   Challenges and Barriers: Patient stated that her challenge is breaking away from her birthday tradition of getting free birthday foods from various restaurants.    Coaching Outcomes: Patient will continue to implement her action steps as outlined above with the modification to the number of pounds she has for her ankle and arm toning weights as outlined below.    Agreement/Action Steps:  Improve health eating behaviors Reduce Sodium Intake Continue to use Mrs. Dash/Salt free Seasonings Read food labels Practice portion control Meal plan/prep for 2-3 days at a time Choose healthier snacks with less sugar    Stress management by implementing self-care plan Engage in physical activity 2-3xs/week for a minimum of 15-30 minutes. Walk to mailbox and/or trash disposal Walk around apartment complex in the morning Use 10-pound ankle and  25-pound arm toning weights when walking Write in journal a minimum of twice weekly.   Discussed with patient strategies that she could implement to help her refrain from getting multiple free food items so that she can maintain her healthy eating action steps. Patient stated that she will choose one free food item for her birthday.  Attempted: Fulfilled - patient completed the monthly agreement in full and was able to meet the challenge.

## 2023-01-07 ENCOUNTER — Telehealth: Payer: Self-pay

## 2023-01-07 DIAGNOSIS — Z0000 Encounter for general adult medical examination without abnormal findings: Secondary | ICD-10-CM

## 2023-01-07 NOTE — Telephone Encounter (Signed)
Called patient to determine if she is experiencing issues with her device due to missing readings. Patient did not answer. Left message for patient to call with an update.   [name], MS, ERHD, [facility]  Care Guide, Health & Wellness Coach [address] [place] [address] Telephone: [phone] Email: [email or web address]

## 2023-01-18 ENCOUNTER — Telehealth: Payer: Self-pay

## 2023-01-18 DIAGNOSIS — Z0000 Encounter for general adult medical examination without abnormal findings: Secondary | ICD-10-CM

## 2023-01-18 NOTE — Telephone Encounter (Signed)
Called patient to determine if she is having issues with her device due to missing bp readings. Patient did not answer. Left message for patient to return call.    [name], MS, ERHD, [facility]  Care Guide, Health & Wellness Coach [address] [place] [address] Telephone: [phone] Email: [email or web address]

## 2023-02-16 ENCOUNTER — Ambulatory Visit: Payer: No Typology Code available for payment source

## 2023-02-16 ENCOUNTER — Telehealth: Payer: Self-pay

## 2023-02-16 DIAGNOSIS — Z0000 Encounter for general adult medical examination without abnormal findings: Secondary | ICD-10-CM

## 2023-02-16 NOTE — Telephone Encounter (Signed)
Called patient to hold health coaching session over the phone. Patient did not answer. Left message for patient to return call to hold session today or to reschedule.    [name], MS, ERHD, [facility]  Care Guide, Health & Wellness Coach [address], [address] [place] [address] Telephone: [phone] Email: [email or web address]

## 2023-02-19 NOTE — Progress Notes (Signed)
Mailed letter to patient regarding missed health coaching appointment and rescheduling the appointment. Patient will be contacted 5-7 business days after the letter was mailed today if patient has not responded prior to that time frame.    [name], MS, ERHD, [facility]  Care Guide, Health & Wellness Coach [address] [place] [address] Telephone: [phone] Email: [email or web address]

## 2023-03-02 NOTE — Progress Notes (Signed)
Patient was mailed a termination letter for health coaching due to missed appointments and no response after multiple attempts to contact patient.    [name], MS, ERHD, [facility]  Care Guide, Health & Wellness Coach [address], [address] [place] [address] Telephone: [phone] Email: [email or web address]

## 2023-04-21 ENCOUNTER — Other Ambulatory Visit: Payer: Self-pay

## 2023-04-21 ENCOUNTER — Other Ambulatory Visit (HOSPITAL_BASED_OUTPATIENT_CLINIC_OR_DEPARTMENT_OTHER): Payer: Self-pay

## 2023-04-21 ENCOUNTER — Encounter (HOSPITAL_BASED_OUTPATIENT_CLINIC_OR_DEPARTMENT_OTHER): Payer: Self-pay | Admitting: Emergency Medicine

## 2023-04-21 ENCOUNTER — Emergency Department (HOSPITAL_BASED_OUTPATIENT_CLINIC_OR_DEPARTMENT_OTHER)
Admission: EM | Admit: 2023-04-21 | Discharge: 2023-04-21 | Disposition: A | Discharge status: Home / Self Care | Payer: No Typology Code available for payment source | Attending: Emergency Medicine | Admitting: Emergency Medicine

## 2023-04-21 ENCOUNTER — Emergency Department (HOSPITAL_BASED_OUTPATIENT_CLINIC_OR_DEPARTMENT_OTHER): Payer: No Typology Code available for payment source | Admitting: Radiology

## 2023-04-21 DIAGNOSIS — S82892A Other fracture of left lower leg, initial encounter for closed fracture: Secondary | ICD-10-CM

## 2023-04-21 DIAGNOSIS — W01198A Fall on same level from slipping, tripping and stumbling with subsequent striking against other object, initial encounter: Secondary | ICD-10-CM | POA: Diagnosis not present

## 2023-04-21 DIAGNOSIS — M79674 Pain in right toe(s): Secondary | ICD-10-CM | POA: Diagnosis not present

## 2023-04-21 DIAGNOSIS — M79644 Pain in right finger(s): Secondary | ICD-10-CM | POA: Insufficient documentation

## 2023-04-21 DIAGNOSIS — S99912A Unspecified injury of left ankle, initial encounter: Secondary | ICD-10-CM | POA: Diagnosis present

## 2023-04-21 DIAGNOSIS — Y9301 Activity, walking, marching and hiking: Secondary | ICD-10-CM | POA: Insufficient documentation

## 2023-04-21 DIAGNOSIS — S8265XA Nondisplaced fracture of lateral malleolus of left fibula, initial encounter for closed fracture: Secondary | ICD-10-CM | POA: Diagnosis not present

## 2023-04-21 DIAGNOSIS — W19XXXA Unspecified fall, initial encounter: Secondary | ICD-10-CM

## 2023-04-21 MED ORDER — IBUPROFEN 400 MG PO TABS
600.0000 mg | ORAL_TABLET | Freq: Once | ORAL | Status: AC
  Administered 2023-04-21: 600 mg via ORAL
  Filled 2023-04-21: qty 1

## 2023-04-21 MED ORDER — BACITRACIN ZINC 500 UNIT/GM EX OINT
TOPICAL_OINTMENT | Freq: Two times a day (BID) | CUTANEOUS | Status: DC
  Administered 2023-04-21: 31.5 via TOPICAL
  Filled 2023-04-21: qty 28.35

## 2023-04-21 MED ORDER — IBUPROFEN 600 MG PO TABS
600.0000 mg | ORAL_TABLET | Freq: Four times a day (QID) | ORAL | 0 refills | Status: DC | PRN
  Filled 2023-04-21: qty 30, 8d supply, fill #0

## 2023-04-21 NOTE — ED Provider Notes (Signed)
[facility] EMERGENCY DEPARTMENT AT [facility] Provider Note   CSN: [id number] Arrival date & time: 04/21/23  1034     History  Chief Complaint  Patient presents with   Colleen Garrett is a 37 y.o. female.  HPI   37 year old female presents emergency department with complaints of fall.  Patient states that she was walking down her back steps attempting to walk her dog when she missed stepped and lost her balance falling backwards.  States that she fell backwards down 2 steps rolling her left ankle and hitting her head on the concrete.  Denies loss of consciousness, blood thinner use, nausea, vomiting.  Currently complaining of left knee, ankle, right great toe, right index finger pain.  Has been able to ambulate since the incident but with pain.  Has taken no medication for her symptoms.  Denies any chest pain, shortness of breath, abdominal pain.   Past medical history significant for hypertension, obesity, depression, anxiety  Home Medications Prior to Admission medications   Medication Sig Start Date End Date Taking? Authorizing Provider  ibuprofen (ADVIL) 600 MG tablet Take 1 tablet (600 mg total) by mouth every 6 (six) hours as needed. 04/21/23  Yes Sherian Maroon A, PA  acetaminophen (TYLENOL) 500 MG tablet Take 2 tablets (1,000 mg total) by mouth 4 (four) times daily. 10/26/22 10/26/23  Diamantina Monks, MD  amLODipine-olmesartan (AZOR) 10-40 MG tablet Take 1 tablet by mouth daily. 12/03/22   Deeann Saint, MD  busPIRone (BUSPAR) 10 MG tablet Take 10 mg by mouth daily.    [provider]  clobetasol (TEMOVATE) 0.05 % external solution Apply 1 Application topically as needed (psoriasis). 04/20/22   [provider]  docusate sodium (COLACE) 100 MG capsule Take 1 capsule (100 mg total) by mouth 2 (two) times daily. 10/26/22 10/26/23  Diamantina Monks, MD  escitalopram (LEXAPRO) 10 MG tablet Take 10 mg by mouth daily. 10/03/21   [provider]  methocarbamol (ROBAXIN-750) 750 MG tablet Take 1 tablet (750 mg total) by mouth 4 (four) times daily. 10/26/22   Diamantina Monks, MD  oxyCODONE (ROXICODONE) 5 MG immediate release tablet Take 1 tablet (5 mg total) by mouth every 4 (four) hours as needed for severe pain. 10/26/22 10/26/23  Diamantina Monks, MD  Prenatal Vit-Fe Fumarate-FA (MULTIVITAMIN-PRENATAL) 27-0.8 MG TABS tablet Take 1 tablet by mouth daily at 12 noon.    [provider]  spironolactone (ALDACTONE) 25 MG tablet Take 1 tablet (25 mg total) by mouth daily. 10/08/22 10/03/23  Alver Sorrow, NP  Vitamin D, Ergocalciferol, (DRISDOL) 1.25 MG (50000 UNIT) CAPS capsule Take 50,000 Units by mouth once a week. 08/28/22   [provider]      Allergies    Doxycycline and Sulfa antibiotics    Review of Systems   Review of Systems  All other systems reviewed and are negative.   Physical Exam Updated Vital Signs BP (!) 155/115 (BP Location: Right Wrist)   Pulse 86   Temp 99.1 F (37.3 C) (Oral)   Resp 20   Ht 5\' 5"  (1.651 m)   Wt (!) 161.1 kg   LMP 04/12/2023 (Approximate)   SpO2 100%   BMI 59.11 kg/m  Physical Exam Vitals and nursing note reviewed.  Constitutional:      General: She is not in acute distress.    Appearance: She is well-developed.  HENT:     Head: Normocephalic and atraumatic.  Eyes:  Conjunctiva/sclera: Conjunctivae normal.  Cardiovascular:     Rate and Rhythm: Normal rate and regular rhythm.     Heart sounds: No murmur heard. Pulmonary:     Effort: Pulmonary effort is normal. No respiratory distress.     Breath sounds: Normal breath sounds.  Abdominal:     Palpations: Abdomen is soft.     Tenderness: There is no abdominal tenderness.  Musculoskeletal:        General: No swelling.     Cervical back: Neck supple.     Comments: No midline tenderness of cervical, thoracic, lumbar spine with no obvious step-off or deformity noted.  No chest wall tenderness to palpation.   Patient with tenderness of middle and distal phalanx of second digit of right hand with small scab appreciated on dorsal aspect of affected digit with no active bleeding.  Patient with full range of motion of digit.  No obvious deformity.  Patient with tenderness palpation with medial and lateral joint line of left knee as well as proximal tibia/fibula with mild abrasion noted on the anterior lateral aspect.  Patient again with abrasion appreciated on the anterior lateral aspect of left ankle with diffuse tenderness to palpation of left ankle.  Pedal and radial pulses 2+ bilaterally.  Full range of motion of bilateral hips, knees, ankles, digits.  Patient also with skin avulsion appreciated on the plantar aspect of right great toe with no active bleeding.  Some tenderness over distal phalanx of digit.  Otherwise, no tenderness to palpation of upper or lower extremities.  No sensory deficits along major nerve distributions of upper or lower extremities.  Skin:    General: Skin is warm and dry.     Capillary Refill: Capillary refill takes less than 2 seconds.  Neurological:     Mental Status: She is alert.     Comments: Alert and oriented to self, place, time and event.   Speech is fluent, clear without dysarthria or dysphasia.   Strength 5/5 in upper/lower extremities   Sensation intact in upper/lower extremities   Normal gait.  CN I not tested  CN II not tested CN III, IV, VI PERRLA and EOMs intact bilaterally  CN V Intact sensation to sharp and light touch to the face  CN VII facial movements symmetric  CN VIII not tested  CN IX, X no uvula deviation, symmetric rise of soft palate  CN XI 5/5 SCM and trapezius strength bilaterally  CN XII Midline tongue protrusion, symmetric L/R movements   Psychiatric:        Mood and Affect: Mood normal.     ED Results / Procedures / Treatments   Labs (all labs ordered are listed, but only abnormal results are displayed) Labs Reviewed - No data to  display  EKG None  Radiology DG Finger Index Right  Result Date: 04/21/2023 CLINICAL DATA:  Fall down steps walking dog, pain. EXAM: RIGHT INDEX FINGER 2+V COMPARISON:  None Available. FINDINGS: There is no acute fracture or dislocation. Bony alignment is normal. The joint spaces are preserved. There is no erosive change. The soft tissues are unremarkable. IMPRESSION: No acute fracture or dislocation. Electronically Signed   By: Lesia Hausen M.D.   On: 04/21/2023 12:34   DG Knee Complete 4 Views Left  Result Date: 04/21/2023 CLINICAL DATA:  Fall down 2 steps while walking dog, ankle injury with skin abrasion and swelling. EXAM: LEFT TIBIA AND FIBULA - 2 VIEW; LEFT KNEE - COMPLETE 4+ VIEW; LEFT FOOT - COMPLETE 3+  VIEW; LEFT ANKLE COMPLETE - 3+ VIEW COMPARISON:  None Available. FINDINGS: Knee: There is no acute fracture or dislocation. Knee alignment is normal. There is no erosive change. 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 The soft tissues are unremarkable. There is no effusion. Tibia/fibula: There is no acute fracture or dislocation. Bony alignment is normal. There is no erosive change. The soft tissues are unremarkable. Ankle: There is a nondisplaced fracture of the lateral malleolus with extension into the ankle mortise. Alignment is normal. The ankle mortise is symmetrically intact. There is soft tissue swelling about the ankle. Foot: There is no acute fracture or dislocation. Bony alignment is normal. There is mild degenerative change of the talonavicular joint and mild Achilles enthesopathy. There is no erosive change. The soft tissues are unremarkable. IMPRESSION: 1. Acute nondisplaced fracture of the lateral malleolus. 2. No other acute fracture or dislocation in the left lower extremity. 3. Age accelerated degenerative change about the knee and talonavicular joint. Electronically Signed   By: Lesia Hausen M.D.   On: 04/21/2023 12:32   DG Tibia/Fibula  Left  Result Date: 04/21/2023 CLINICAL DATA:  Fall down 2 steps while walking dog, ankle injury with skin abrasion and swelling. EXAM: LEFT TIBIA AND FIBULA - 2 VIEW; LEFT KNEE - COMPLETE 4+ VIEW; LEFT FOOT - COMPLETE 3+ VIEW; LEFT ANKLE COMPLETE - 3+ VIEW COMPARISON:  None Available. FINDINGS: Knee: There is no acute fracture or dislocation. Knee alignment is normal. There is no erosive change. 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 The soft tissues are unremarkable. There is no effusion. Tibia/fibula: There is no acute fracture or dislocation. Bony alignment is normal. There is no erosive change. The soft tissues are unremarkable. Ankle: There is a nondisplaced fracture of the lateral malleolus with extension into the ankle mortise. Alignment is normal. The ankle mortise is symmetrically intact. There is soft tissue swelling about the ankle. Foot: There is no acute fracture or dislocation. Bony alignment is normal. There is mild degenerative change of the talonavicular joint and mild Achilles enthesopathy. There is no erosive change. The soft tissues are unremarkable. IMPRESSION: 1. Acute nondisplaced fracture of the lateral malleolus. 2. No other acute fracture or dislocation in the left lower extremity. 3. Age accelerated degenerative change about the knee and talonavicular joint. Electronically Signed   By: Lesia Hausen M.D.   On: 04/21/2023 12:32   DG Ankle Complete Left  Result Date: 04/21/2023 CLINICAL DATA:  Fall down 2 steps while walking dog, ankle injury with skin abrasion and swelling. EXAM: LEFT TIBIA AND FIBULA - 2 VIEW; LEFT KNEE - COMPLETE 4+ VIEW; LEFT FOOT - COMPLETE 3+ VIEW; LEFT ANKLE COMPLETE - 3+ VIEW COMPARISON:  None Available. FINDINGS: Knee: There is no acute fracture or dislocation. Knee alignment is normal. There is no erosive change. 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 The soft tissues are unremarkable. There is no  effusion. Tibia/fibula: There is no acute fracture or dislocation. Bony alignment is normal. There is no erosive change. The soft tissues are unremarkable. Ankle: There is a nondisplaced fracture of the lateral malleolus with extension into the ankle mortise. Alignment is normal. The ankle mortise is symmetrically intact. There is soft tissue swelling about the ankle. Foot: There is no acute fracture or dislocation. Bony alignment is normal. There is mild degenerative change of the talonavicular joint and mild Achilles enthesopathy. There is no erosive change. The soft tissues are unremarkable. IMPRESSION: 1. Acute nondisplaced fracture of the  lateral malleolus. 2. No other acute fracture or dislocation in the left lower extremity. 3. Age accelerated degenerative change about the knee and talonavicular joint. Electronically Signed   By: Lesia Hausen M.D.   On: 04/21/2023 12:32   DG Foot Complete Left  Result Date: 04/21/2023 CLINICAL DATA:  Fall down 2 steps while walking dog, ankle injury with skin abrasion and swelling. EXAM: LEFT TIBIA AND FIBULA - 2 VIEW; LEFT KNEE - COMPLETE 4+ VIEW; LEFT FOOT - COMPLETE 3+ VIEW; LEFT ANKLE COMPLETE - 3+ VIEW COMPARISON:  None Available. FINDINGS: Knee: There is no acute fracture or dislocation. Knee alignment is normal. There is no erosive change. 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 The soft tissues are unremarkable. There is no effusion. Tibia/fibula: There is no acute fracture or dislocation. Bony alignment is normal. There is no erosive change. The soft tissues are unremarkable. Ankle: There is a nondisplaced fracture of the lateral malleolus with extension into the ankle mortise. Alignment is normal. The ankle mortise is symmetrically intact. There is soft tissue swelling about the ankle. Foot: There is no acute fracture or dislocation. Bony alignment is normal. There is mild degenerative change of the talonavicular joint and mild  Achilles enthesopathy. There is no erosive change. The soft tissues are unremarkable. IMPRESSION: 1. Acute nondisplaced fracture of the lateral malleolus. 2. No other acute fracture or dislocation in the left lower extremity. 3. Age accelerated degenerative change about the knee and talonavicular joint. Electronically Signed   By: Lesia Hausen M.D.   On: 04/21/2023 12:32   DG Toe Great Right  Result Date: 04/21/2023 CLINICAL DATA:  Larey Seat down 2 steps while walking dog EXAM: RIGHT GREAT TOE COMPARISON:  None Available. FINDINGS: There is no acute fracture or dislocation. Bony alignment is normal. The joint spaces are preserved. There is no erosive change. There is soft tissue swelling about the great toe with a probable soft tissue injury at the tip. IMPRESSION: Soft tissue swelling with probable soft tissue injury at the tip of the great toe. No underlying fracture or dislocation. Electronically Signed   By: Lesia Hausen M.D.   On: 04/21/2023 12:27    Procedures Procedures    Medications Ordered in ED Medications  bacitracin ointment (31.5 Applications Topical Given 04/21/23 1113)  ibuprofen (ADVIL) tablet 600 mg (600 mg Oral Given 04/21/23 1113)    ED Course/ Medical Decision Making/ A&P                                 Medical Decision Making Amount and/or Complexity of Data Reviewed Radiology: ordered.  Risk OTC drugs. Prescription drug management.   This patient presents to the ED for concern of fall, this involves an extensive number of treatment options, and is a complaint that carries with it a high risk of complications and morbidity.  The differential diagnosis includes CVA, fracture, strain/sprain, dislocation, ligamentous/tendinous injury, neurovascular compromise   Co morbidities that complicate the patient evaluation  See HPI   Additional history obtained:  Additional history obtained from EMR External records from outside source obtained and reviewed including  hospital records   Lab Tests:  N/a  Imaging Studies ordered:  I ordered imaging studies   I independently visualized and interpreted imaging which showed  X-ray of right index finger: No acute osseous abnormality X-ray of left knee: Osteoarthritis.  No acute osseous abnormality X-ray of left tibia/fibula: No acute osseous abnormality.  X-ray left ankle/foot: Nondisplaced lateral malleolus fracture but other wise no acute osseous abnormality X-ray right great toe: No acute osseous abnormality I agree with the radiologist interpretation   Cardiac Monitoring: / EKG:  The patient was maintained on a cardiac monitor.  I personally viewed and interpreted the cardiac monitored which showed an underlying rhythm of: Sinus rhythm   Consultations Obtained:  N/a   Problem List / ED Course / Critical interventions / Medication management  Fall I ordered medication including Motrin, bacitracin  Reevaluation of the patient after these medicines showed that the patient improved I have reviewed the patients home medicines and have made adjustments as needed   Social Determinants of Health:  Denies tobacco, licit drug use   Test / Admission - Considered:  Fall Vitals signs significant for hypertension blood pressure 155 systolic. Otherwise within normal range and stable throughout visit. Imaging studies significant for: See above 37 year old female presents emergency department after mechanical fall with complaints of left knee, left ankle and left foot pain as well as right great toe and right index finger pain.  On exam, reproducible tenderness in affected area.  No evidence of neurovascular compromise.  Patient with multiple skin abrasions but no obvious laceration repairable; proper at home wound care discussed at length with patient.  X-ray imagings were concerning for left lateral nondisplaced malleolus fracture.  Patient mobilizing given crutches for injury and will recommend close  follow-up with orthopedics in the outpatient setting.  Symptomatic therapy recommended in the form of rest, ice, elevation, NSAIDs.  Otherwise, imaging studies negative for any acute process.  Patient reassured by overall workup.  Treatment plan discussed at length with patient and she acknowledged understanding was agreeable to said plan.  Patient overall well-appearing, afebrile in no acute distress. Worrisome signs and symptoms were discussed with the patient, and the patient acknowledged understanding to return to the ED if noticed. Patient was stable upon discharge.          Final Clinical Impression(s) / ED Diagnoses Final diagnoses:  Closed fracture of left ankle, initial encounter  Fall, initial encounter    Rx / DC Orders ED Discharge Orders          Ordered    ibuprofen (ADVIL) 600 MG tablet  Every 6 hours PRN        04/21/23 1241              Peter Garter, [place] 04/21/23 1303    Jacalyn Lefevre, MD 04/21/23 1401

## 2023-04-21 NOTE — ED Triage Notes (Signed)
Fell down two steps while walking her dog , landed backward , head injury against concrete .  Left ankle injury , swelling and skin abrasion in the area . Right toe pain , skin torn . Bleeding controlled .   No loc .  Reports stood up and walked after the fall .

## 2023-04-21 NOTE — Discharge Instructions (Signed)
As discussed, you do have evidence of fracture of your left ankle.  Recommend wearing boot as well as remain nonweightbearing until follow-up with orthopedics in the outpatient setting.  Call number attached to discharge papers to set up an appointment.  Recommend rest, ice, elevation, NSAIDs in the form of ibuprofen.  Please do not hesitate to return to emergency department for worrisome signs and symptoms we discussed become apparent.

## 2023-05-03 ENCOUNTER — Other Ambulatory Visit (HOSPITAL_BASED_OUTPATIENT_CLINIC_OR_DEPARTMENT_OTHER): Payer: Self-pay

## 2023-06-03 ENCOUNTER — Telehealth: Payer: Self-pay

## 2023-06-03 DIAGNOSIS — Z0000 Encounter for general adult medical examination without abnormal findings: Secondary | ICD-10-CM

## 2023-06-03 NOTE — Telephone Encounter (Signed)
Called to advise patient to return Viv[employer] device. Patient did not answer. Left message for patient to return call to determine when she would be able to come to the office to return device.   [name], MS, ERHD, [facility]  Care Guide, Health & Wellness Coach [address], [address] [place] [address] Telephone: [phone] Email: [email or web address]

## 2023-06-29 ENCOUNTER — Telehealth: Payer: Self-pay | Admitting: Family Medicine

## 2023-06-29 NOTE — Telephone Encounter (Signed)
Requesting provider consider a weight loss medication that was discussed in previous appointment, requests a call.

## 2023-07-05 NOTE — Telephone Encounter (Signed)
Patient last seen in April 2024 to establish care.  Will need to set up an office visit.

## 2023-07-13 NOTE — Telephone Encounter (Signed)
Called spoke with patient got her set up with an appointment 12/11

## 2023-07-21 ENCOUNTER — Ambulatory Visit: Payer: No Typology Code available for payment source | Admitting: Family Medicine

## 2023-07-21 ENCOUNTER — Encounter: Payer: Self-pay | Admitting: Family Medicine

## 2023-07-21 VITALS — BP 176/80 | HR 93 | Temp 98.0°F | Ht 65.0 in | Wt 367.2 lb

## 2023-07-21 DIAGNOSIS — E049 Nontoxic goiter, unspecified: Secondary | ICD-10-CM | POA: Diagnosis not present

## 2023-07-21 DIAGNOSIS — F419 Anxiety disorder, unspecified: Secondary | ICD-10-CM | POA: Diagnosis not present

## 2023-07-21 DIAGNOSIS — F4321 Adjustment disorder with depressed mood: Secondary | ICD-10-CM | POA: Diagnosis not present

## 2023-07-21 DIAGNOSIS — Z6841 Body Mass Index (BMI) 40.0 and over, adult: Secondary | ICD-10-CM

## 2023-07-21 DIAGNOSIS — Z0000 Encounter for general adult medical examination without abnormal findings: Secondary | ICD-10-CM | POA: Diagnosis not present

## 2023-07-21 DIAGNOSIS — M25572 Pain in left ankle and joints of left foot: Secondary | ICD-10-CM | POA: Diagnosis not present

## 2023-07-21 DIAGNOSIS — Z23 Encounter for immunization: Secondary | ICD-10-CM | POA: Diagnosis not present

## 2023-07-21 DIAGNOSIS — F324 Major depressive disorder, single episode, in partial remission: Secondary | ICD-10-CM

## 2023-07-21 DIAGNOSIS — L409 Psoriasis, unspecified: Secondary | ICD-10-CM

## 2023-07-21 DIAGNOSIS — I10 Essential (primary) hypertension: Secondary | ICD-10-CM | POA: Diagnosis not present

## 2023-07-21 LAB — CBC WITH DIFFERENTIAL/PLATELET
Basophils Absolute: 0.1 10*3/uL (ref 0.0–0.1)
Basophils Relative: 1.2 % (ref 0.0–3.0)
Eosinophils Absolute: 0.4 10*3/uL (ref 0.0–0.7)
Eosinophils Relative: 8.7 % — ABNORMAL HIGH (ref 0.0–5.0)
HCT: 36.2 % (ref 36.0–46.0)
Hemoglobin: 11.5 g/dL — ABNORMAL LOW (ref 12.0–15.0)
Lymphocytes Relative: 29.1 % (ref 12.0–46.0)
Lymphs Abs: 1.3 10*3/uL (ref 0.7–4.0)
MCHC: 31.7 g/dL (ref 30.0–36.0)
MCV: 83.1 fL (ref 78.0–100.0)
Monocytes Absolute: 0.6 10*3/uL (ref 0.1–1.0)
Monocytes Relative: 12.7 % — ABNORMAL HIGH (ref 3.0–12.0)
Neutro Abs: 2.2 10*3/uL (ref 1.4–7.7)
Neutrophils Relative %: 48.3 % (ref 43.0–77.0)
Platelets: 296 10*3/uL (ref 150.0–400.0)
RBC: 4.36 Mil/uL (ref 3.87–5.11)
RDW: 15.2 % (ref 11.5–15.5)
WBC: 4.6 10*3/uL (ref 4.0–10.5)

## 2023-07-21 LAB — COMPREHENSIVE METABOLIC PANEL
ALT: 11 U/L (ref 0–35)
AST: 14 U/L (ref 0–37)
Albumin: 3.7 g/dL (ref 3.5–5.2)
Alkaline Phosphatase: 60 U/L (ref 39–117)
BUN: 9 mg/dL (ref 6–23)
CO2: 29 meq/L (ref 19–32)
Calcium: 8.8 mg/dL (ref 8.4–10.5)
Chloride: 102 meq/L (ref 96–112)
Creatinine, Ser: 0.77 mg/dL (ref 0.40–1.20)
GFR: 99.87 mL/min (ref >60.00)
Glucose, Bld: 85 mg/dL (ref 70–99)
Potassium: 4.4 meq/L (ref 3.5–5.1)
Sodium: 138 meq/L (ref 135–145)
Total Bilirubin: 0.4 mg/dL (ref 0.2–1.2)
Total Protein: 7.8 g/dL (ref 6.0–8.3)

## 2023-07-21 LAB — LIPID PANEL
Cholesterol: 133 mg/dL (ref 0–200)
HDL: 43.8 mg/dL (ref >39.00)
LDL Cholesterol: 77 mg/dL (ref 0–99)
NonHDL: 89.5
Total CHOL/HDL Ratio: 3
Triglycerides: 64 mg/dL (ref 0.0–149.0)
VLDL: 12.8 mg/dL (ref 0.0–40.0)

## 2023-07-21 LAB — HEMOGLOBIN A1C: Hgb A1c MFr Bld: 5.7 % (ref 4.6–6.5)

## 2023-07-21 LAB — VITAMIN D 25 HYDROXY (VIT D DEFICIENCY, FRACTURES): VITD: 11.89 ng/mL — ABNORMAL LOW (ref 30.00–100.00)

## 2023-07-21 LAB — TSH: TSH: 1.9 u[IU]/mL (ref 0.35–5.50)

## 2023-07-21 LAB — T4, FREE: Free T4: 0.88 ng/dL (ref 0.60–1.60)

## 2023-07-21 MED ORDER — CLOBETASOL PROPIONATE 0.05 % EX SOLN: 1.0000 | CUTANEOUS | 0 refills | Status: DC | PRN

## 2023-07-21 NOTE — Progress Notes (Signed)
Established Patient Office Visit   Subjective  Patient ID: Colleen Garrett, female    DOB: [birth year]  Age: 37 y.o. MRN: [id number]  Chief Complaint  Patient presents with   Annual Exam   Depression    Patient's brother passed away in 2023/02/22 and has some depression, and weight gain, irregular periods     Patient is a 37 year old female seen for CPE and additional concerns.  Patient endorses having a hard time dealing with the death of her brother over the summer.  Patient states they were very close.  Patient under increased stress helping care for her nephew helping to care for her 39-year-old nephew who constantly asked where his father is.  Patient states relationship with her nephew's mother is strained.  Patient was hoping to get off of anxiety/depression medicine but is afraid she will be unable to.  Patient stopped checking her blood pressure and has not had recent follow-up with advanced HTN clinic/cardiology.  Patient states she is taking medication regularly.  States had salty foods last night.  Patient endorses weight gain due to binge eating.  Previously on weight loss medication but had to stop after requiring gallbladder surgery.  Patient advised to wait 3 months before restarting.  Patient has not been exercising.  States fracturing ankle in September made it difficult.  States left ankle becomes painful with prolonged standing or increased walking.  Ankle also pops.  Psoriasis and scalp flaring.  Has to plaques on forehead.    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 01/07/2019   Past Medical History:  Diagnosis Date   Anemia    Anxiety    Chlamydia    Depression    Hypertension    Obesity    UTI (lower urinary tract infection)    proteus UTI   Past Surgical History:   Procedure Laterality Date   CHOLECYSTECTOMY N/A 10/26/2022   Procedure: LAPAROSCOPIC CHOLECYSTECTOMY;  Surgeon: Diamantina Monks, MD;  Location: [facility];  Service: General;  Laterality: N/A;   NO PAST SURGERIES     Social History   Tobacco Use   Smoking status: Never   Smokeless tobacco: Never  Vaping Use   Vaping status: Never Used  Substance Use Topics   Alcohol use: No   Drug use: Yes    Types: Marijuana    Comment: daily   Family History  Problem Relation Age of Onset   Heart attack Mother    Hypertension Mother    Heart attack Father    Hypertension Father    Hyperlipidemia Father    Stroke Sister    Obesity Sister    Hyperlipidemia Brother    Skin cancer Brother    Coronary artery disease Brother    Allergies  Allergen Reactions   Doxycycline Nausea Only and Other (See Comments)    Nausea and dizziness   Sulfa Antibiotics Nausea And Vomiting    Dizziness, nausea, vomiting      ROS Negative unless stated above    Objective:     BP (!) 180/96 (BP Location: Right Wrist, Patient Position: Sitting, Cuff Size: Normal) Comment: patient was crying  Pulse 93   Temp 98 F (36.7 C) (Oral)   Ht 5\' 5"  (1.651 m)   Wt (!) 367 lb 3.2 oz (166.6 kg)  LMP 07/19/2023 (Exact Date)   SpO2 93%   BMI 61.11 kg/m  BP Readings from Last 3 Encounters:  07/21/23 (!) 180/96  04/21/23 (!) 155/115  12/03/22 128/84   Wt Readings from Last 3 Encounters:  07/21/23 (!) 367 lb 3.2 oz (166.6 kg)  04/21/23 (!) 355 lb 3.2 oz (161.1 kg)  12/03/22 (!) 353 lb 6.4 oz (160.3 kg)      Physical Exam Constitutional:      Appearance: Normal appearance.  HENT:     Head: Normocephalic and atraumatic.     Right Ear: Tympanic membrane, ear canal and external ear normal.     Left Ear: Tympanic membrane, ear canal and external ear normal.     Nose: Nose normal.     Mouth/Throat:     Mouth: Mucous membranes are moist.     Pharynx: No oropharyngeal exudate or posterior oropharyngeal  erythema.  Eyes:     General: No scleral icterus.    Extraocular Movements: Extraocular movements intact.     Conjunctiva/sclera: Conjunctivae normal.     Pupils: Pupils are equal, round, and reactive to light.  Neck:     Thyroid: Thyromegaly present.     Comments: Nodule in lower L lobe of thryroid and medial lateral R lobe, mild thyromegaly Cardiovascular:     Rate and Rhythm: Normal rate and regular rhythm.     Pulses: Normal pulses.     Heart sounds: Normal heart sounds. No murmur heard.    No friction rub.  Pulmonary:     Effort: Pulmonary effort is normal.     Breath sounds: Normal breath sounds. No wheezing, rhonchi or rales.  Abdominal:     General: Bowel sounds are normal.     Palpations: Abdomen is soft.     Tenderness: There is no abdominal tenderness.  Musculoskeletal:        General: No deformity. Normal range of motion.  Lymphadenopathy:     Cervical: No cervical adenopathy.  Skin:    General: Skin is warm and dry.     Findings: No lesion.     Comments: Hypopigmented plaques on right forehead at hairline and in scalp  Neurological:     General: No focal deficit present.     Mental Status: She is alert and oriented to person, place, and time.  Psychiatric:        Mood and Affect: Mood normal. Affect is tearful.        Behavior: Behavior is cooperative.        Thought Content: Thought content normal.       07/21/2023    8:43 AM 12/03/2022    9:45 AM 04/20/2015    8:57 AM  Depression screen PHQ 2/9  Decreased Interest 0 0 0  Down, Depressed, Hopeless 1 0 0  PHQ - 2 Score 1 0 0  Altered sleeping 0 0   Tired, decreased energy  0   Change in appetite 1 0   Feeling bad or failure about yourself  0 0   Trouble concentrating 0 0   Moving slowly or fidgety/restless 0 0   Suicidal thoughts 0 0   PHQ-9 Score 2 0   Difficult doing work/chores Not difficult at all        07/21/2023    8:44 AM 12/03/2022    9:46 AM  GAD 7 : Generalized Anxiety Score  Nervous,  Anxious, on Edge 0 0  Control/stop worrying 0 0  Worry too much - different things 1  0  Trouble relaxing 0 0  Restless 0 0  Easily annoyed or irritable 0 0  Afraid - awful might happen 0 0  Total GAD 7 Score 1 0  Anxiety Difficulty Not difficult at all       No results found for any visits on 07/21/23.    Assessment & Plan:  Well adult exam -Age-appropriate health screenings discussed -Obtain labs -Immunizations reviewed -Patient declined Pap this visit.  To be scheduled at a later date due to menses. -Next CPE in 1 year -     Lipid panel -     CBC with Differential/Platelet  Essential hypertension -Uncontrolled -Patient declines med adjustment this visit. -Continue amlodipine-olmesartan 10-40 mg daily, spironolactone 25 mg daily -Patient advised to start checking BP at home and keeping a log to bring with her to clinic/for results to be sent to cardiology -Patient encouraged to schedule follow-up appointment with advanced hypertension clinic -     Comprehensive metabolic panel -     Lipid panel -     CBC with Differential/Platelet  Anxiety -     TSH -     T4, free  Depression, major, single episode, in partial remission (HCC) -Currently complicated by grief -Continue BuSpar 10 mg daily and Lexapro 10 mg daily -Patient encouraged start counseling -     TSH -     T4, free  Need for influenza vaccination -     Flu vaccine trivalent PF, 6mos and older(Flulaval,Afluria,Fluarix,Fluzone)  Need for tetanus booster -     Tdap vaccine greater than or equal to 7yo IM  Grief -Continue current medications -Patient advised to start counseling.  Consider family counseling -Continue to monitor closely  Morbid obesity (HCC) -Body mass index is 61.11 kg/m. -Stress importance of lifestyle modifications -Encouraged to start counseling due to history of binge eating. -Patient advised to set a goal to avoid eating past 10 PM -     Comprehensive metabolic panel -      Hemoglobin A1c -     Lipid panel -     CBC with Differential/Platelet -     VITAMIN D 25 Hydroxy (Vit-D Deficiency, Fractures)  Left ankle pain, unspecified chronicity -     CBC with Differential/Platelet  Goiter -Mild thyromegaly noted on exam with 1-2 nodules appreciated -Obtain labs and ultrasound.  Further recommendations based on results -     TSH -     T4, free -     US THYROID; Future  Psoriasis -Continue follow-up with dermatology -Continue clobetasol       -      Clobetasol  Return in about 4 weeks (around 08/18/2023).   Deeann Saint, MD

## 2023-07-26 ENCOUNTER — Other Ambulatory Visit: Payer: Self-pay | Admitting: Family Medicine

## 2023-07-26 DIAGNOSIS — Z1231 Encounter for screening mammogram for malignant neoplasm of breast: Secondary | ICD-10-CM

## 2023-07-26 DIAGNOSIS — E559 Vitamin D deficiency, unspecified: Secondary | ICD-10-CM

## 2023-07-26 MED ORDER — VITAMIN D (ERGOCALCIFEROL) 1.25 MG (50000 UNIT) PO CAPS: 50000.0000 [IU] | ORAL_CAPSULE | ORAL | 0 refills | Status: AC

## 2023-07-27 ENCOUNTER — Encounter: Payer: Self-pay | Admitting: Family Medicine

## 2023-07-30 ENCOUNTER — Encounter: Payer: Self-pay | Admitting: Family Medicine

## 2023-07-30 ENCOUNTER — Ambulatory Visit: Payer: No Typology Code available for payment source | Admitting: Family Medicine

## 2023-07-30 ENCOUNTER — Other Ambulatory Visit (HOSPITAL_COMMUNITY)
Admission: RE | Admit: 2023-07-30 | Discharge: 2023-07-30 | Disposition: A | Discharge status: Home / Self Care | Payer: No Typology Code available for payment source | Source: Ambulatory Visit | Attending: Family Medicine | Admitting: Family Medicine

## 2023-07-30 VITALS — BP 142/84 | HR 64 | Temp 98.6°F | Ht 65.0 in | Wt 366.4 lb

## 2023-07-30 DIAGNOSIS — K644 Residual hemorrhoidal skin tags: Secondary | ICD-10-CM | POA: Diagnosis not present

## 2023-07-30 DIAGNOSIS — Z124 Encounter for screening for malignant neoplasm of cervix: Secondary | ICD-10-CM | POA: Diagnosis present

## 2023-07-30 DIAGNOSIS — I10 Essential (primary) hypertension: Secondary | ICD-10-CM

## 2023-07-30 NOTE — Progress Notes (Signed)
Established Patient Office Visit   Subjective  Patient ID: Colleen Garrett, female    DOB: [birth year]  Age: 37 y.o. MRN: [id number]  Chief Complaint  Patient presents with   Gynecologic Exam    Pt is a 37 yo female seen for PAP.  Had recent CPE 07/21/2023 however was on cycle and wanted to have pap done at a later date.  Pt denies d/c, irritation, concern,  not currently sexually active.  BP improving since last OFV.  Patient gave away all the cookies and snacks in her home.  Serious about losing weight.  Lost 1 pound since last visit on 07/21/2023.  Patient notes history of hemorrhoids.  No issues with this since but notes a little piece of skin near her rectum that can become irritated if patient has frequent loose stools.  Denies bleeding, itching, edema.    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 01/07/2019   Past Medical History:  Diagnosis Date   Anemia    Anxiety    Chlamydia    Depression    Hypertension    Obesity    UTI (lower urinary tract infection)    proteus UTI   Past Surgical History:  Procedure Laterality Date   CHOLECYSTECTOMY N/A 10/26/2022   Procedure: LAPAROSCOPIC CHOLECYSTECTOMY;  Surgeon: Diamantina Monks, MD;  Location: [facility];  Service: General;  Laterality: N/A;   NO PAST SURGERIES     Social History   Tobacco Use   Smoking status: Never   Smokeless tobacco: Never  Vaping Use   Vaping status: Never Used  Substance Use Topics   Alcohol use: No   Drug use: Yes    Types: Marijuana    Comment: daily   Family History  Problem Relation Age of Onset   Heart attack Mother    Hypertension Mother    Heart attack Father    Hypertension Father    Hyperlipidemia Father    Stroke Sister    Obesity Sister    Hyperlipidemia Brother    Skin cancer  Brother    Coronary artery disease Brother    Allergies  Allergen Reactions   Doxycycline Nausea Only and Other (See Comments)    Nausea and dizziness   Sulfa Antibiotics Nausea And Vomiting    Dizziness, nausea, vomiting      ROS Negative unless stated above    Objective:     BP (!) 142/84 (BP Location: Right Arm, Patient Position: Sitting, Cuff Size: Large)   Pulse 64   Temp 98.6 F (37 C) (Oral)   Ht 5\' 5"  (1.651 m)   Wt (!) 366 lb 6.4 oz (166.2 kg)   LMP 07/19/2023 (Exact Date)   SpO2 97%   BMI 60.97 kg/m  BP Readings from Last 3 Encounters:  07/30/23 (!) 142/84  07/21/23 (!) 176/80  04/21/23 (!) 155/115   Wt Readings from Last 3 Encounters:  07/30/23 (!) 366 lb 6.4 oz (166.2 kg)  07/21/23 (!) 367 lb 3.2 oz (166.6 kg)  04/21/23 (!) 355 lb 3.2 oz (161.1 kg)      Physical Exam Constitutional:      Appearance: Normal appearance.  HENT:     Head: Normocephalic and atraumatic.     Nose: Nose normal.     Mouth/Throat:  Mouth: Mucous membranes are moist.  Eyes:     Extraocular Movements: Extraocular movements intact.     Conjunctiva/sclera: Conjunctivae normal.  Cardiovascular:     Rate and Rhythm: Normal rate.  Pulmonary:     Effort: Pulmonary effort is normal.  Genitourinary:    General: Normal vulva.     Labia:        Right: No rash.        Left: No rash.      Vagina: Vaginal discharge present. No tenderness, bleeding or lesions.     Uterus: Normal.      Adnexa: Right adnexa normal and left adnexa normal.     Comments: Rectal skin tag. Skin:    General: Skin is warm and dry.  Neurological:     Mental Status: She is alert and oriented to person, place, and time.      No results found for any visits on 07/30/23.    Assessment & Plan:  Essential hypertension  Cervical cancer screening -     Cytology - PAP  Residual hemorrhoidal skin tags  BP elevated but improving since last OFV.  Patient to continue lifestyle modifications.   Continue checking BP at home and bring a log to bring with you to clinic.  Residual skin tag noted.  Supportive care with OTC hemorrhoid medications if needed.  For irritation or bleeding consider removal.  Pap obtained this visit.  Return in about 6 weeks (around 09/10/2023), or if symptoms worsen or fail to improve, for blood pressure.   Deeann Saint, MD

## 2023-08-05 ENCOUNTER — Ambulatory Visit
Admission: RE | Admit: 2023-08-05 | Discharge: 2023-08-05 | Disposition: A | Discharge status: Home / Self Care | Payer: No Typology Code available for payment source | Source: Ambulatory Visit | Attending: Family Medicine | Admitting: Family Medicine

## 2023-08-05 DIAGNOSIS — E049 Nontoxic goiter, unspecified: Secondary | ICD-10-CM

## 2023-08-05 LAB — CYTOLOGY - PAP
Adequacy: ABSENT
Chlamydia: NEGATIVE
Comment: NEGATIVE
Comment: NEGATIVE
Comment: NEGATIVE
Comment: NORMAL
Diagnosis: UNDETERMINED — AB
High risk HPV: NEGATIVE
Neisseria Gonorrhea: NEGATIVE
Trichomonas: NEGATIVE

## 2023-08-24 ENCOUNTER — Ambulatory Visit: Payer: No Typology Code available for payment source

## 2023-10-17 ENCOUNTER — Other Ambulatory Visit (HOSPITAL_BASED_OUTPATIENT_CLINIC_OR_DEPARTMENT_OTHER): Payer: Self-pay | Admitting: Family

## 2023-10-17 DIAGNOSIS — I10 Essential (primary) hypertension: Secondary | ICD-10-CM

## 2023-11-12 ENCOUNTER — Other Ambulatory Visit (HOSPITAL_BASED_OUTPATIENT_CLINIC_OR_DEPARTMENT_OTHER): Payer: Self-pay | Admitting: Family

## 2023-11-12 DIAGNOSIS — I10 Essential (primary) hypertension: Secondary | ICD-10-CM

## 2023-11-16 ENCOUNTER — Other Ambulatory Visit (HOSPITAL_BASED_OUTPATIENT_CLINIC_OR_DEPARTMENT_OTHER): Payer: Self-pay | Admitting: Family

## 2023-11-16 DIAGNOSIS — I10 Essential (primary) hypertension: Secondary | ICD-10-CM

## 2023-12-01 ENCOUNTER — Encounter: Payer: Self-pay | Admitting: Family Medicine

## 2023-12-12 ENCOUNTER — Other Ambulatory Visit (HOSPITAL_BASED_OUTPATIENT_CLINIC_OR_DEPARTMENT_OTHER): Payer: Self-pay | Admitting: Family

## 2023-12-12 DIAGNOSIS — I10 Essential (primary) hypertension: Secondary | ICD-10-CM

## 2023-12-13 ENCOUNTER — Other Ambulatory Visit (HOSPITAL_BASED_OUTPATIENT_CLINIC_OR_DEPARTMENT_OTHER): Payer: Self-pay

## 2023-12-13 ENCOUNTER — Telehealth: Payer: Self-pay

## 2023-12-13 DIAGNOSIS — I10 Essential (primary) hypertension: Secondary | ICD-10-CM

## 2023-12-13 MED ORDER — SPIRONOLACTONE 25 MG PO TABS: ORAL_TABLET | ORAL | 0 refills | Status: DC

## 2023-12-13 MED ORDER — SPIRONOLACTONE 25 MG PO TABS
25.0000 mg | ORAL_TABLET | Freq: Every day | ORAL | 0 refills | Status: DC
Start: 2023-12-13 — End: 2024-02-17
  Filled 2023-12-13 – 2024-02-08 (×2): qty 15, 15d supply, fill #0

## 2023-12-13 NOTE — Telephone Encounter (Signed)
 Copied from CRM [id number]. Topic: General - Other >> Dec 13, 2023  1:30 PM Lonzell Robin C wrote: Reason for CRM: Patient stated she still needs the weight loss medication ithat Dr. Arliss Lam suggested. t's really important. Please contact patient regarding this matter.

## 2023-12-13 NOTE — Telephone Encounter (Signed)
 Called and sch patient to come in for a follow-up and weight loss medication

## 2023-12-16 ENCOUNTER — Other Ambulatory Visit (HOSPITAL_BASED_OUTPATIENT_CLINIC_OR_DEPARTMENT_OTHER): Payer: Self-pay | Admitting: Family

## 2023-12-16 ENCOUNTER — Telehealth: Payer: Self-pay

## 2023-12-16 ENCOUNTER — Encounter: Payer: Self-pay | Admitting: Family Medicine

## 2023-12-16 ENCOUNTER — Ambulatory Visit: Admitting: Family Medicine

## 2023-12-16 DIAGNOSIS — I10 Essential (primary) hypertension: Secondary | ICD-10-CM | POA: Diagnosis not present

## 2023-12-16 DIAGNOSIS — L409 Psoriasis, unspecified: Secondary | ICD-10-CM

## 2023-12-16 MED ORDER — SEMAGLUTIDE(0.25 OR 0.5MG/DOS) 2 MG/1.5ML ~~LOC~~ SOPN
0.2500 mg | PEN_INJECTOR | SUBCUTANEOUS | 0 refills | Status: DC
Start: 2023-12-16 — End: 2023-12-24

## 2023-12-16 NOTE — Telephone Encounter (Signed)

## 2023-12-16 NOTE — Progress Notes (Unsigned)
 Established Patient Office Visit   Subjective  Patient ID: Colleen Garrett, female    DOB: Jan 09, 1986  Age: 38 y.o. MRN: [id number]  Chief Complaint  Patient presents with  . Weight Loss  . Labs Only    Patient is a 38 year old female seen for follow-up.  Patient last seen December 2024.   Patient Active Problem List   Diagnosis Date Noted  . Essential hypertension 12/03/2022  . Morbid obesity (HCC) 12/03/2022  . Anxiety 12/03/2022  . 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 Allergy to sulfa  drugs 01/07/2019   Past Medical History:  Diagnosis Date  . Anemia   . Anxiety   . Chlamydia   . Depression   . Hypertension   . Obesity   . UTI (lower urinary tract infection)    proteus UTI   Past Surgical History:  Procedure Laterality Date  . CHOLECYSTECTOMY N/A 10/26/2022   Procedure: LAPAROSCOPIC CHOLECYSTECTOMY;  Surgeon: Anda Bamberg, MD;  Location: [facility];  Service: General;  Laterality: N/A;  . NO PAST SURGERIES     Social History   Tobacco Use  . Smoking status: Never  . Smokeless tobacco: Never  Vaping Use  . Vaping status: Never Used  Substance Use Topics  . Alcohol use: No  . Drug use: Yes    Types: Marijuana    Comment: daily   Family History  Problem Relation Age of Onset  . Heart attack Mother   . Hypertension Mother   . Heart attack Father   . Hypertension Father   . Hyperlipidemia Father   . Stroke Sister   . Obesity Sister   . Hyperlipidemia Brother   . Skin cancer Brother   . Coronary artery disease Brother    Allergies  Allergen Reactions  . Doxycycline  Nausea Only and Other (See Comments)    Nausea and dizziness  . Sulfa  Antibiotics Nausea And Vomiting    Dizziness, nausea, vomiting      ROS Negative unless stated above    Objective:      BP 136/80 (BP Location: Left Wrist, Patient Position: Sitting, Cuff Size: Normal)   Pulse 97   Temp 98.4 F (36.9 C) (Oral)   Ht 5\' 5"  (1.651 m)    Wt (!) 377 lb (171 kg)   LMP  (LMP Unknown)   SpO2 97%   BMI 62.74 kg/m  BP Readings from Last 3 Encounters:  12/16/23 136/80  07/30/23 (!) 142/84  07/21/23 (!) 176/80   Wt Readings from Last 3 Encounters:  12/16/23 (!) 377 lb (171 kg)  07/30/23 (!) 366 lb 6.4 oz (166.2 kg)  07/21/23 (!) 367 lb 3.2 oz (166.6 kg)      Physical Exam     07/30/2023    8:51 AM 07/21/2023    8:43 AM 12/03/2022    9:45 AM  Depression screen PHQ 2/9  Decreased Interest 0 0 0  Down, Depressed, Hopeless 0 1 0  PHQ - 2 Score 0 1 0  Altered sleeping 0 0 0  Tired, decreased energy 0  0  Change in appetite 0 1 0  Feeling bad or failure about yourself  0 0 0  Trouble concentrating 0 0 0  Moving slowly or fidgety/restless 0 0 0  Suicidal thoughts 0 0 0  PHQ-9 Score 0 2 0  Difficult doing work/chores Not difficult at all Not difficult at all       07/30/2023    8:54 AM 07/21/2023  8:44 AM 12/03/2022    9:46 AM  GAD 7 : Generalized Anxiety Score  Nervous, Anxious, on Edge 0 0 0  Control/stop worrying 0 0 0  Worry too much - different things 0 1 0  Trouble relaxing 0 0 0  Restless 0 0 0  Easily annoyed or irritable 0 0 0  Afraid - awful might happen 0 0 0  Total GAD 7 Score 0 1 0  Anxiety Difficulty Not difficult at all Not difficult at all      No results found for any visits on 12/16/23.    Assessment & Plan:  There are no diagnoses linked to this encounter.  No follow-ups on file.   Viola Greulich, MD

## 2023-12-17 ENCOUNTER — Other Ambulatory Visit (HOSPITAL_BASED_OUTPATIENT_CLINIC_OR_DEPARTMENT_OTHER): Payer: Self-pay | Admitting: Family Medicine

## 2023-12-17 DIAGNOSIS — I10 Essential (primary) hypertension: Secondary | ICD-10-CM

## 2023-12-23 ENCOUNTER — Other Ambulatory Visit (HOSPITAL_BASED_OUTPATIENT_CLINIC_OR_DEPARTMENT_OTHER): Payer: Self-pay

## 2023-12-24 ENCOUNTER — Other Ambulatory Visit: Payer: Self-pay | Admitting: Family Medicine

## 2023-12-24 ENCOUNTER — Other Ambulatory Visit (HOSPITAL_COMMUNITY): Payer: Self-pay

## 2023-12-24 ENCOUNTER — Telehealth: Payer: Self-pay

## 2023-12-24 MED ORDER — TIRZEPATIDE-WEIGHT MANAGEMENT 2.5 MG/0.5ML ~~LOC~~ SOAJ: 2.5000 mg | SUBCUTANEOUS | 0 refills | Status: AC

## 2023-12-24 MED ORDER — TIRZEPATIDE-WEIGHT MANAGEMENT 5 MG/0.5ML ~~LOC~~ SOAJ: 5.0000 mg | SUBCUTANEOUS | 1 refills | Status: AC

## 2023-12-24 NOTE — Telephone Encounter (Signed)
 A prescription for Zepbound was sent to pt's pharmacy.

## 2023-12-24 NOTE — Telephone Encounter (Signed)
 Pharmacy Patient Advocate Encounter   Received notification from Patient Pharmacy that prior authorization for Zepbound 5 is required/requested.   Insurance verification completed.   The patient is insured through [facility] ADVANTAGE/RX ADVANCE .   Per test claim: PA required; PA submitted to above mentioned insurance via CoverMyMeds Key/confirmation #/EOC [id number] Status is pending

## 2023-12-28 ENCOUNTER — Other Ambulatory Visit (HOSPITAL_COMMUNITY): Payer: Self-pay

## 2023-12-28 NOTE — Telephone Encounter (Signed)
 Pharmacy Patient Advocate Encounter  Received notification from [facility] ADVANTAGE/RX ADVANCE that Prior Authorization for Zepbound  5 has been CANCELLED due to Patient must enroll in [facility] for coverage [phone]  PA #/Case ID/Reference #: [id number]

## 2023-12-29 ENCOUNTER — Other Ambulatory Visit (HOSPITAL_COMMUNITY): Payer: Self-pay

## 2023-12-29 NOTE — Telephone Encounter (Signed)
 Noted

## 2024-01-13 ENCOUNTER — Other Ambulatory Visit: Payer: Self-pay | Admitting: Family Medicine

## 2024-01-13 DIAGNOSIS — I10 Essential (primary) hypertension: Secondary | ICD-10-CM

## 2024-01-17 ENCOUNTER — Other Ambulatory Visit (HOSPITAL_COMMUNITY): Payer: Self-pay

## 2024-01-17 ENCOUNTER — Telehealth: Payer: Self-pay

## 2024-01-17 NOTE — Telephone Encounter (Signed)
 Pharmacy Patient Advocate Encounter   Received notification from Patient Pharmacy that prior authorization for Zepbound  5 is required/requested.   Insurance verification completed.   The patient is insured through [facility] .   See encounter 12/23/23. Patient must enroll in [facility] for coverage per patient plan.

## 2024-02-08 ENCOUNTER — Other Ambulatory Visit: Payer: Self-pay

## 2024-02-10 ENCOUNTER — Other Ambulatory Visit: Payer: Self-pay

## 2024-02-17 ENCOUNTER — Ambulatory Visit (HOSPITAL_BASED_OUTPATIENT_CLINIC_OR_DEPARTMENT_OTHER): Admitting: Family

## 2024-02-17 ENCOUNTER — Encounter (HOSPITAL_BASED_OUTPATIENT_CLINIC_OR_DEPARTMENT_OTHER): Payer: Self-pay | Admitting: Family

## 2024-02-17 VITALS — BP 126/68 | HR 98 | Ht 65.0 in | Wt 375.0 lb

## 2024-02-17 DIAGNOSIS — I10 Essential (primary) hypertension: Secondary | ICD-10-CM | POA: Diagnosis not present

## 2024-02-17 MED ORDER — SPIRONOLACTONE 25 MG PO TABS: 25.0000 mg | ORAL_TABLET | Freq: Every day | ORAL | 1 refills | Status: DC

## 2024-02-17 MED ORDER — AMLODIPINE-OLMESARTAN 10-40 MG PO TABS: 1.0000 | ORAL_TABLET | Freq: Every day | ORAL | 1 refills | Status: AC

## 2024-02-17 NOTE — Patient Instructions (Addendum)
 Medication Instructions:  Your physician recommends that you continue on your current medications as directed. Please refer to the Current Medication list given to you today.  Follow-Up: Please follow up in 6 months in [facility] with Dr. Raford, Reche Finder, NP or Allean Mink PharmD    Special Instructions:    [facility] Patient Billing at [phone].

## 2024-02-17 NOTE — Progress Notes (Signed)
 [facility] Assessment:    Date:  02/19/2024   ID:  Colleen Garrett, DOB 17-Nov-1985, MRN [id number]  PCP:  Colleen Clotilda SAUNDERS, MD  Cardiologist:  None  Nephrologist:  Referring MD: Janifer Graeme LABOR, NP   CC: Hypertension  History of Present Illness:    Colleen Garrett is a 38 y.o. female with a hx of anxiety, hypertension, cholelithiasis here to follow up in the [facility]. Family history notable for stroke in her sister, mother with MI, brother with multiple heart surgeries and heart disease.  Established with [facility] 07/23/22. Diagnosed with hypertension many years prior but did not tolerate hydrochlorothiazide  with urinary frequency. BP was previously better on Saxenda due to weight loss but had to stop due to GI intolerance. No tobacco use though did report using marijuana. No formal exercise routine and endorsed eating a high sodium diet. At initial visit she was recommended to start Amlodipine -Olmesartan  5-40mg  daily. Renal duplex 08/2022 no renal artery stenosis. Lab workup including TSH, cortisol, catecholamines, metanephrines were unremarkable.    Underwent cholecystectomy 10/2022. At visit 12/16/23 with PCP Semaglutide  resumed.  Presents today for overdue follow up in [facility]. Reports home SBP 113-120. Enjoy spending time with her 56 year old nephew. Per dispense report, last picked up Amlodipine -Olmesartan  10-40mg  90 day supply on 10/17/23 and Spironolactone  25mg  30 day supply on 11/16/23. However, reportsin the past 2 months notes only missing 2 days of their medication. She has accountability with her sisters where she reminds then to take her medicine.    Previous antihypertensives: HCTZ - urinary frequency   Past Medical History:  Diagnosis Date   Anemia    Anxiety    Chlamydia    Depression    Hypertension    Obesity    UTI (lower urinary tract infection)    proteus UTI    Past Surgical History:   Procedure Laterality Date   CHOLECYSTECTOMY N/A 10/26/2022   Procedure: LAPAROSCOPIC CHOLECYSTECTOMY;  Surgeon: Paola Dreama SAILOR, MD;  Location: [facility];  Service: General;  Laterality: N/A;   NO PAST SURGERIES      Current Medications: Current Meds  Medication Sig   Prenatal Vit-Fe Fumarate-FA (MULTIVITAMIN-PRENATAL) 27-0.8 MG TABS tablet Take 1 tablet by mouth daily at 12 noon.   Vitamin D , Ergocalciferol , (DRISDOL ) 1.25 MG (50000 UNIT) CAPS capsule Take 1 capsule (50,000 Units total) by mouth every 7 (seven) days.   [DISCONTINUED] amLODipine -olmesartan  (AZOR ) 10-40 MG tablet TAKE 1 TABLET BY MOUTH EVERY DAY   [DISCONTINUED] spironolactone  (ALDACTONE ) 25 MG tablet Take 1 tablet (25 mg total) by mouth daily. Need appointment for refills.     Allergies:   Doxycycline  and Sulfa  antibiotics   Social History   Socioeconomic History   Marital status: Single    Spouse name: Not on file   Number of children: Not on file   Years of education: Not on file   Highest education level: 12th grade  Occupational History   Not on file  Tobacco Use   Smoking status: Never   Smokeless tobacco: Never  Vaping Use   Vaping status: Never Used  Substance and Sexual Activity   Alcohol use: No   Drug use: Yes    Types: Marijuana    Comment: daily   Sexual activity: Yes    Birth control/protection: None    Comment: last contact 4.17.2013  Other Topics Concern   Not on file  Social History Narrative   Not on file  Social Drivers of Corporate investment banker Strain: Low Risk  (07/29/2023)   Overall Financial Resource Strain (CARDIA)    Difficulty of Paying Living Expenses: Not hard at all  Food Insecurity: No Food Insecurity (07/29/2023)   Hunger Vital Sign    Worried About Running Out of Food in the Last Year: Never true    Ran Out of Food in the Last Year: Never true  Transportation Needs: No Transportation Needs (07/29/2023)   PRAPARE - Administrator, Civil Service  (Medical): No    Lack of Transportation (Non-Medical): No  Physical Activity: Insufficiently Active (07/29/2023)   Exercise Vital Sign    Days of Exercise per Week: 2 days    Minutes of Exercise per Session: 20 min  Stress: No Stress Concern Present (07/29/2023)   [employer] of Occupational Health - Occupational Stress Questionnaire    Feeling of Stress : Not at all  Social Connections: Moderately Isolated (07/29/2023)   Social Connection and Isolation Panel    Frequency of Communication with Friends and Family: More than three times a week    Frequency of Social Gatherings with Friends and Family: Twice a week    Attends Religious Services: More than 4 times per year    Active Member of [employer] or Organizations: No    Attends Engineer, structural: Not on file    Marital Status: Never married     Family History: The patient's family history includes Coronary artery disease in her brother; Heart attack in her father and mother; Hyperlipidemia in her brother and father; Hypertension in her father and mother; Obesity in her sister; Skin cancer in her brother; Stroke in her sister.  ROS:   Please see the history of present illness.    All other systems reviewed and are negative.  EKGs/Labs/Other Studies Reviewed:    EKG Interpretation Date/Time:  Thursday February 17 2024 09:01:11 EDT Ventricular Rate:  96 PR Interval:  198 QRS Duration:  88 QT Interval:  362 QTC Calculation: 457 R Axis:   33  Text Interpretation: Normal sinus rhythm nonspecific IVCD Confirmed by Vannie Mora ([id number]) on 02/17/2024 9:09:03 AM    Recent Labs: 07/21/2023: ALT 11; BUN 9; Creatinine, Ser 0.77; Hemoglobin 11.5; Platelets 296.0; Potassium 4.4; Sodium 138; TSH 1.90   Recent Lipid Panel    Component Value Date/Time   CHOL 133 07/21/2023 0930   CHOL 115 07/23/2022 1009   TRIG 64.0 07/21/2023 0930   HDL 43.80 07/21/2023 0930   HDL 34 (L) 07/23/2022 1009   CHOLHDL 3 07/21/2023 0930    VLDL 12.8 07/21/2023 0930   LDLCALC 77 07/21/2023 0930   LDLCALC 66 07/23/2022 1009    Physical Exam:   VS:  BP 126/68   Pulse 98   Ht 5' 5 (1.651 m)   Wt (!) 375 lb (170.1 kg)   LMP 02/05/2024   SpO2 99%   BMI 62.40 kg/m  , BMI Body mass index is 62.4 kg/m.  Vitals:   02/17/24 0855 02/17/24 0947  BP: (!) 148/104 126/68  Pulse: 98   Height: 5' 5 (1.651 m)   Weight: (!) 375 lb (170.1 kg)   SpO2: 99%   BMI (Calculated): 62.4     GENERAL:  Well appearing HEENT: Pupils equal round and reactive, fundi not visualized, oral mucosa unremarkable NECK:  No jugular venous distention, waveform within normal limits, carotid upstroke brisk and symmetric, no bruits, no thyromegaly LYMPHATICS:  No cervical adenopathy LUNGS:  Clear  to auscultation bilaterally HEART:  RRR.  PMI not displaced or sustained,S1 and S2 within normal limits, no S3, no S4, no clicks, no rubs, no murmurs ABD:  Flat, positive bowel sounds normal in frequency in pitch, no bruits, no rebound, no guarding, no midline pulsatile mass, no hepatomegaly, no splenomegaly EXT:  2 plus pulses throughout, no edema, no cyanosis no clubbing SKIN:  No rashes no nodules NEURO:  Cranial nerves II through XII grossly intact, motor grossly intact throughout PSYCH:  Cognitively intact, oriented to person place and time   ASSESSMENT/PLAN:    HTN - initial BP 148/104 with repeat 126/68 without intervention. Reports home BP at home <130/80. Continue amldoipine-olmesartan  10-40mg  daily and spironolactone  25mg  daily. Refills provided. Discussed to monitor BP at home at least 2 hours after medications and sitting for 5-10 minutes.   Obesity - Weight loss via diet and exercise encouraged. Discussed the impact being overweight would have on cardiovascular risk. Working with [employer] per her insurance to get on Zepbound .   Screening for Secondary Hypertension:     07/23/2022   10:15 AM  Causes  Drugs/Herbals Screened     - Comments  marijuana  Renovascular HTN Screened  Thyroid  Disease Screened  Hyperaldosteronism Screened  Pheochromocytoma Screened  Cushing's Syndrome Screened    Relevant Labs/Studies:    Latest Ref Rng & Units 07/21/2023    9:30 AM 10/13/2022   10:10 AM 09/29/2022    8:29 AM  Basic Labs  Sodium 135 - 145 mEq/L 138  136  141   Potassium 3.5 - 5.1 mEq/L 4.4  3.8  3.9   Creatinine 0.40 - 1.20 mg/dL 9.22  9.14  9.21        Latest Ref Rng & Units 07/21/2023    9:30 AM 07/23/2022   10:09 AM  Thyroid    TSH 0.35 - 5.50 uIU/mL 1.90  0.693           Latest Ref Rng & Units 07/23/2022   10:09 AM  Metanephrines/Catecholamines   Epinephrine  0 - 62 pg/mL <15   Norepinephrine 0 - 874 pg/mL 325   Dopamine 0 - 48 pg/mL <30   Metanephrines 0.0 - 88.0 pg/mL <25.0   Normetanephrines  0.0 - 210.1 pg/mL 63.3           08/20/2022   11:04 AM  Renovascular   Renal Artery US  Completed Yes    Disposition:    FU with MD/APP/PharmD in 6 months    Medication Adjustments/Labs and Tests Ordered: Current medicines are reviewed at length with the patient today.  Concerns regarding medicines are outlined above.  Orders Placed This Encounter  Procedures   EKG 12-Lead   Meds ordered this encounter  Medications   amLODipine -olmesartan  (AZOR ) 10-40 MG tablet    Sig: Take 1 tablet by mouth daily.    Dispense:  90 tablet    Refill:  1    Supervising Provider:   LONNI SLAIN [[id number]]   spironolactone  (ALDACTONE ) 25 MG tablet    Sig: Take 1 tablet (25 mg total) by mouth daily. Need appointment for refills.    Dispense:  90 tablet    Refill:  1    Supervising Provider:   LONNI SLAIN [[id number]]     Signed, Reche GORMAN Finder, NP  02/19/2024 9:45 AM    [facility]

## 2024-06-16 ENCOUNTER — Other Ambulatory Visit: Payer: Self-pay | Admitting: Family Medicine

## 2024-06-19 NOTE — Telephone Encounter (Signed)
 Medication under history and provider historical. Please advise further.

## 2024-08-19 ENCOUNTER — Other Ambulatory Visit (HOSPITAL_BASED_OUTPATIENT_CLINIC_OR_DEPARTMENT_OTHER): Payer: Self-pay | Admitting: Family

## 2024-08-19 DIAGNOSIS — I10 Essential (primary) hypertension: Secondary | ICD-10-CM
